# Patient Record
Sex: Male | Born: 1985 | Race: White | Hispanic: No | Marital: Married | Smoking: Light tobacco smoker
Health system: Southern US, Community
[De-identification: ages and names within clinical notes are randomized; demographics above are authoritative.]

## PROBLEM LIST (undated history)

## (undated) DIAGNOSIS — F419 Anxiety disorder, unspecified: Secondary | ICD-10-CM

## (undated) DIAGNOSIS — M109 Gout, unspecified: Secondary | ICD-10-CM

## (undated) DIAGNOSIS — R7989 Other specified abnormal findings of blood chemistry: Secondary | ICD-10-CM

## (undated) DIAGNOSIS — E785 Hyperlipidemia, unspecified: Secondary | ICD-10-CM

## (undated) DIAGNOSIS — R945 Abnormal results of liver function studies: Secondary | ICD-10-CM

## (undated) DIAGNOSIS — F17200 Nicotine dependence, unspecified, uncomplicated: Secondary | ICD-10-CM

## (undated) DIAGNOSIS — I10 Essential (primary) hypertension: Secondary | ICD-10-CM

## (undated) DIAGNOSIS — F1010 Alcohol abuse, uncomplicated: Secondary | ICD-10-CM

## (undated) HISTORY — DX: Nicotine dependence, unspecified, uncomplicated: F17.200

## (undated) HISTORY — DX: Gout, unspecified: M10.9

## (undated) HISTORY — DX: Anxiety disorder, unspecified: F41.9

## (undated) HISTORY — DX: Essential (primary) hypertension: I10

## (undated) HISTORY — DX: Other specified abnormal findings of blood chemistry: R79.89

## (undated) HISTORY — DX: Alcohol abuse, uncomplicated: F10.10

## (undated) HISTORY — DX: Hyperlipidemia, unspecified: E78.5

## (undated) HISTORY — DX: Abnormal results of liver function studies: R94.5

---

## 2018-05-01 ENCOUNTER — Other Ambulatory Visit: Payer: Self-pay

## 2018-05-01 ENCOUNTER — Inpatient Hospital Stay (HOSPITAL_COMMUNITY)
Admission: EM | Admit: 2018-05-01 | Discharge: 2018-05-10 | DRG: 897 | Disposition: A | Discharge status: Home / Self Care | Payer: Managed Care, Other (non HMO) | Attending: Internal Medicine | Admitting: Internal Medicine

## 2018-05-01 ENCOUNTER — Encounter (HOSPITAL_COMMUNITY): Payer: Self-pay

## 2018-05-01 DIAGNOSIS — M109 Gout, unspecified: Secondary | ICD-10-CM | POA: Diagnosis present

## 2018-05-01 DIAGNOSIS — Z716 Tobacco abuse counseling: Secondary | ICD-10-CM

## 2018-05-01 DIAGNOSIS — R000 Tachycardia, unspecified: Secondary | ICD-10-CM | POA: Diagnosis not present

## 2018-05-01 DIAGNOSIS — F10931 Alcohol use, unspecified with withdrawal delirium: Secondary | ICD-10-CM | POA: Diagnosis present

## 2018-05-01 DIAGNOSIS — Y901 Blood alcohol level of 20-39 mg/100 ml: Secondary | ICD-10-CM | POA: Diagnosis present

## 2018-05-01 DIAGNOSIS — F1010 Alcohol abuse, uncomplicated: Secondary | ICD-10-CM | POA: Diagnosis not present

## 2018-05-01 DIAGNOSIS — E876 Hypokalemia: Secondary | ICD-10-CM | POA: Diagnosis present

## 2018-05-01 DIAGNOSIS — E669 Obesity, unspecified: Secondary | ICD-10-CM | POA: Diagnosis present

## 2018-05-01 DIAGNOSIS — R440 Auditory hallucinations: Secondary | ICD-10-CM | POA: Diagnosis present

## 2018-05-01 DIAGNOSIS — M25529 Pain in unspecified elbow: Secondary | ICD-10-CM

## 2018-05-01 DIAGNOSIS — M7021 Olecranon bursitis, right elbow: Secondary | ICD-10-CM | POA: Diagnosis not present

## 2018-05-01 DIAGNOSIS — F17210 Nicotine dependence, cigarettes, uncomplicated: Secondary | ICD-10-CM | POA: Diagnosis present

## 2018-05-01 DIAGNOSIS — R441 Visual hallucinations: Secondary | ICD-10-CM | POA: Diagnosis present

## 2018-05-01 DIAGNOSIS — I10 Essential (primary) hypertension: Secondary | ICD-10-CM | POA: Diagnosis present

## 2018-05-01 DIAGNOSIS — F419 Anxiety disorder, unspecified: Secondary | ICD-10-CM | POA: Diagnosis present

## 2018-05-01 DIAGNOSIS — F17200 Nicotine dependence, unspecified, uncomplicated: Secondary | ICD-10-CM | POA: Diagnosis present

## 2018-05-01 DIAGNOSIS — Z7141 Alcohol abuse counseling and surveillance of alcoholic: Secondary | ICD-10-CM

## 2018-05-01 DIAGNOSIS — Z720 Tobacco use: Secondary | ICD-10-CM | POA: Diagnosis not present

## 2018-05-01 DIAGNOSIS — R945 Abnormal results of liver function studies: Secondary | ICD-10-CM | POA: Diagnosis present

## 2018-05-01 DIAGNOSIS — M10021 Idiopathic gout, right elbow: Secondary | ICD-10-CM | POA: Diagnosis not present

## 2018-05-01 DIAGNOSIS — R740 Nonspecific elevation of levels of transaminase and lactic acid dehydrogenase [LDH]: Secondary | ICD-10-CM | POA: Diagnosis not present

## 2018-05-01 DIAGNOSIS — Z6837 Body mass index (BMI) 37.0-37.9, adult: Secondary | ICD-10-CM | POA: Diagnosis not present

## 2018-05-01 DIAGNOSIS — M1990 Unspecified osteoarthritis, unspecified site: Secondary | ICD-10-CM | POA: Diagnosis not present

## 2018-05-01 DIAGNOSIS — F10231 Alcohol dependence with withdrawal delirium: Principal | ICD-10-CM | POA: Diagnosis present

## 2018-05-01 LAB — CBC
HCT: 44.9 % (ref 39.0–52.0)
Hemoglobin: 16.2 g/dL (ref 13.0–17.0)
MCH: 33.3 pg (ref 26.0–34.0)
MCHC: 36.1 g/dL — ABNORMAL HIGH (ref 30.0–36.0)
MCV: 92.4 fL (ref 80.0–100.0)
Platelets: 290 10*3/uL (ref 150–400)
RBC: 4.86 MIL/uL (ref 4.22–5.81)
RDW: 12.1 % (ref 11.5–15.5)
WBC: 6.3 10*3/uL (ref 4.0–10.5)
nRBC: 0 % (ref 0.0–0.2)

## 2018-05-01 LAB — COMPREHENSIVE METABOLIC PANEL
ALT: 189 U/L — ABNORMAL HIGH (ref 0–44)
AST: 165 U/L — ABNORMAL HIGH (ref 15–41)
Albumin: 3.7 g/dL (ref 3.5–5.0)
Alkaline Phosphatase: 44 U/L (ref 38–126)
Anion gap: 18 — ABNORMAL HIGH (ref 5–15)
CO2: 17 mmol/L — ABNORMAL LOW (ref 22–32)
Calcium: 9 mg/dL (ref 8.9–10.3)
Chloride: 100 mmol/L (ref 98–111)
Creatinine, Ser: 0.77 mg/dL (ref 0.61–1.24)
GFR calc Af Amer: >60 mL/min (ref >60)
Glucose, Bld: 141 mg/dL — ABNORMAL HIGH (ref 70–99)
Potassium: 3.5 mmol/L (ref 3.5–5.1)
Sodium: 135 mmol/L (ref 135–145)
Total Bilirubin: 0.8 mg/dL (ref 0.3–1.2)
Total Protein: 7.2 g/dL (ref 6.5–8.1)

## 2018-05-01 LAB — I-STAT TROPONIN, ED: Troponin i, poc: 0 ng/mL (ref 0.00–0.08)

## 2018-05-01 LAB — MAGNESIUM: MAGNESIUM: 1.5 mg/dL — AB (ref 1.7–2.4)

## 2018-05-01 LAB — ETHANOL: Alcohol, Ethyl (B): 32 mg/dL — ABNORMAL HIGH (ref <10)

## 2018-05-01 LAB — PHOSPHORUS: Phosphorus: 3.4 mg/dL (ref 2.5–4.6)

## 2018-05-01 MED ORDER — LORAZEPAM 2 MG/ML IJ SOLN
0.0000 mg | Freq: Four times a day (QID) | INTRAMUSCULAR | Status: AC
  Administered 2018-05-01: 2 mg via INTRAVENOUS
  Administered 2018-05-02: 4 mg via INTRAVENOUS
  Administered 2018-05-02: 1 mg via INTRAVENOUS
  Administered 2018-05-02: 3 mg via INTRAVENOUS
  Administered 2018-05-03: 1 mg via INTRAVENOUS
  Administered 2018-05-03: 2 mg via INTRAVENOUS
  Administered 2018-05-03 (×2): 1 mg via INTRAVENOUS
  Filled 2018-05-01: qty 1
  Filled 2018-05-01: qty 2
  Filled 2018-05-01 (×5): qty 1
  Filled 2018-05-01: qty 2

## 2018-05-01 MED ORDER — ONDANSETRON HCL 4 MG PO TABS: 4.0000 mg | ORAL_TABLET | Freq: Four times a day (QID) | ORAL | Status: DC | PRN

## 2018-05-01 MED ORDER — LORAZEPAM 1 MG PO TABS: 1.0000 mg | ORAL_TABLET | Freq: Four times a day (QID) | ORAL | Status: AC | PRN

## 2018-05-01 MED ORDER — NICOTINE 21 MG/24HR TD PT24
21.0000 mg | MEDICATED_PATCH | Freq: Every day | TRANSDERMAL | Status: DC
  Administered 2018-05-01 – 2018-05-10 (×10): 21 mg via TRANSDERMAL
  Filled 2018-05-01 (×10): qty 1

## 2018-05-01 MED ORDER — LORAZEPAM 2 MG/ML IJ SOLN
0.0000 mg | Freq: Four times a day (QID) | INTRAMUSCULAR | Status: DC
  Administered 2018-05-01 (×2): 2 mg via INTRAVENOUS
  Filled 2018-05-01 (×2): qty 1

## 2018-05-01 MED ORDER — ONDANSETRON HCL 4 MG/2ML IJ SOLN: 4.0000 mg | Freq: Four times a day (QID) | INTRAMUSCULAR | Status: DC | PRN

## 2018-05-01 MED ORDER — HEPARIN SODIUM (PORCINE) 5000 UNIT/ML IJ SOLN
5000.0000 [IU] | Freq: Three times a day (TID) | INTRAMUSCULAR | Status: DC
  Filled 2018-05-01 (×2): qty 1

## 2018-05-01 MED ORDER — SODIUM CHLORIDE 0.9 % IV SOLN
INTRAVENOUS | Status: DC
  Administered 2018-05-01 (×2): via INTRAVENOUS

## 2018-05-01 MED ORDER — LORAZEPAM 1 MG PO TABS: 0.0000 mg | ORAL_TABLET | Freq: Four times a day (QID) | ORAL | Status: DC

## 2018-05-01 MED ORDER — DEXTROSE-NACL 5-0.45 % IV SOLN
INTRAVENOUS | Status: DC
  Administered 2018-05-01 – 2018-05-03 (×3): via INTRAVENOUS

## 2018-05-01 MED ORDER — LORAZEPAM 1 MG PO TABS: 0.0000 mg | ORAL_TABLET | Freq: Two times a day (BID) | ORAL | Status: DC

## 2018-05-01 MED ORDER — LORAZEPAM 2 MG/ML IJ SOLN: 0.0000 mg | INTRAMUSCULAR | Status: DC

## 2018-05-01 MED ORDER — LORAZEPAM 2 MG/ML IJ SOLN
0.0000 mg | Freq: Two times a day (BID) | INTRAMUSCULAR | Status: AC
  Administered 2018-05-04 – 2018-05-05 (×4): 1 mg via INTRAVENOUS
  Filled 2018-05-01 (×4): qty 1

## 2018-05-01 MED ORDER — VITAMIN B-1 100 MG PO TABS
100.0000 mg | ORAL_TABLET | Freq: Every day | ORAL | Status: DC
  Administered 2018-05-01 – 2018-05-10 (×10): 100 mg via ORAL
  Filled 2018-05-01 (×10): qty 1

## 2018-05-01 MED ORDER — LORAZEPAM 2 MG/ML IJ SOLN: 1.0000 mg | INTRAMUSCULAR | Status: DC | PRN

## 2018-05-01 MED ORDER — LORAZEPAM 2 MG/ML IJ SOLN
1.0000 mg | Freq: Four times a day (QID) | INTRAMUSCULAR | Status: AC | PRN
  Administered 2018-05-02 – 2018-05-04 (×2): 1 mg via INTRAVENOUS
  Filled 2018-05-01 (×2): qty 1

## 2018-05-01 MED ORDER — LORAZEPAM 1 MG PO TABS: 0.0000 mg | ORAL_TABLET | ORAL | Status: DC

## 2018-05-01 MED ORDER — MAGNESIUM SULFATE 2 GM/50ML IV SOLN
2.0000 g | Freq: Once | INTRAVENOUS | Status: AC
  Administered 2018-05-01: 2 g via INTRAVENOUS
  Filled 2018-05-01: qty 50

## 2018-05-01 MED ORDER — ADULT MULTIVITAMIN W/MINERALS CH
1.0000 | ORAL_TABLET | Freq: Every day | ORAL | Status: DC
  Administered 2018-05-02 – 2018-05-10 (×9): 1 via ORAL
  Filled 2018-05-01 (×9): qty 1

## 2018-05-01 MED ORDER — THIAMINE HCL 100 MG/ML IJ SOLN
Freq: Once | INTRAVENOUS | Status: AC
  Administered 2018-05-02: 01:00:00 via INTRAVENOUS
  Filled 2018-05-01: qty 1000

## 2018-05-01 MED ORDER — LORAZEPAM 2 MG/ML IJ SOLN: 0.0000 mg | Freq: Two times a day (BID) | INTRAMUSCULAR | Status: DC

## 2018-05-01 MED ORDER — FOLIC ACID 5 MG/ML IJ SOLN
1.0000 mg | Freq: Every day | INTRAMUSCULAR | Status: DC
  Administered 2018-05-01 – 2018-05-02 (×2): 1 mg via INTRAVENOUS
  Filled 2018-05-01 (×2): qty 0.2

## 2018-05-01 NOTE — H&P (Signed)
History and Physical   Sean Ibarra WUJ:[id number] DOB: [birth year] DOA: 05/01/2018  Referring MD/NP/PA: Dr Clarene DukeLittle  PCP: Patient, No Pcp Per   Outpatient Specialists: None  Patient coming from: Home  Chief Complaint: Alcohol withdrawal  HPI: Sean Ibarra is a 33 y.o. male with medical history significant of Alcohol abuse, who drinks excessively and went on a 3-week binge of alcohol drinking about half a gallon of liquor every day.  Patient has been taking whiskey most of the time.  His last drink was about 2 hours prior to arrival in the ER.  He has been trying to decrease his alcohol intake and then started feeling generalized body aches chest pain nausea chills.  Patient came in to the ER for treatment.  He is hoping to quit drinking.  No fever no chills at the moment.  No diarrhea.  Patient however is having significant withdrawal symptoms and not safe to go home so he is being admitted to the hospital for treatment..  ED Course: Temperature is 98.7 blood pressure 179/125 pulse 117 respiratory of 25 oxygen sat 97% on room air.  CBC within normal chemistry showed a CO2 of 17 otherwise all within normal.  Glucose 141.  Patient received IV fluids and is being admitted to the hospital.  Review of Systems: As per HPI otherwise 10 point review of systems negative.    History reviewed. No pertinent past medical history.  History reviewed. No pertinent surgical history.   reports that he has quit smoking. His smoking use included cigarettes. He smoked 0.50 packs per day. He has never used smokeless tobacco. He reports that he does not use drugs. No history on file for alcohol.  No Known Allergies  No family history on file.   Prior to Admission medications   Not on File    Physical Exam: Vitals:   05/01/18 2015 05/01/18 2030 05/01/18 2045 05/01/18 2100  BP: (!) 143/86 (!) 150/98 (!) 149/105 (!) 150/98  Pulse: (!) 104 (!) 110 (!) 110 (!) 103  Resp: (!) 25 20 (!) 21   Temp:        TempSrc:      SpO2: 96% 95% 96%       Constitutional: Tremulous, irritable, no acute distress Vitals:   05/01/18 2015 05/01/18 2030 05/01/18 2045 05/01/18 2100  BP: (!) 143/86 (!) 150/98 (!) 149/105 (!) 150/98  Pulse: (!) 104 (!) 110 (!) 110 (!) 103  Resp: (!) 25 20 (!) 21   Temp:      TempSrc:      SpO2: 96% 95% 96%    Eyes: PERRL, lids and conjunctivae normal ENMT: Mucous membranes are moist. Posterior pharynx clear of any exudate or lesions.Normal dentition.  Neck: normal, supple, no masses, no thyromegaly Respiratory: clear to auscultation bilaterally, no wheezing, no crackles. Normal respiratory effort. No accessory muscle use.  Cardiovascular: Regular rate and rhythm, no murmurs / rubs / gallops. No extremity edema. 2+ pedal pulses. No carotid bruits.  Abdomen: no tenderness, no masses palpated. No hepatosplenomegaly. Bowel sounds positive.  Musculoskeletal: no clubbing / cyanosis. No joint deformity upper and lower extremities. Good ROM, no contractures. Normal muscle tone.  Skin: no rashes, lesions, ulcers. No induration Neurologic: CN 2-12 grossly intact. Sensation intact, DTR normal. Strength 5/5 in all 4.  Psychiatric: Normal judgment and insight. Alert and oriented x 3. Normal mood.     Labs on Admission: I have personally reviewed following labs and imaging studies  CBC: Recent Labs  Lab 05/01/18  1919  WBC 6.3  HGB 16.2  HCT 44.9  MCV 92.4  PLT 290   Basic Metabolic Panel: Recent Labs  Lab 05/01/18 1919  NA 135  K 3.5  CL 100  CO2 17*  GLUCOSE 141*  BUN <5*  CREATININE 0.77  CALCIUM 9.0  MG 1.5*  PHOS 3.4   GFR: CrCl cannot be calculated (Unknown ideal weight.). Liver Function Tests: Recent Labs  Lab 05/01/18 1919  AST 165*  ALT 189*  ALKPHOS 44  BILITOT 0.8  PROT 7.2  ALBUMIN 3.7   No results for input(s): LIPASE, AMYLASE in the last 168 hours. No results for input(s): AMMONIA in the last 168 hours. Coagulation Profile: No  results for input(s): INR, PROTIME in the last 168 hours. Cardiac Enzymes: No results for input(s): CKTOTAL, CKMB, CKMBINDEX, TROPONINI in the last 168 hours. BNP (last 3 results) No results for input(s): PROBNP in the last 8760 hours. HbA1C: No results for input(s): HGBA1C in the last 72 hours. CBG: No results for input(s): GLUCAP in the last 168 hours. Lipid Profile: No results for input(s): CHOL, HDL, LDLCALC, TRIG, CHOLHDL, LDLDIRECT in the last 72 hours. Thyroid Function Tests: No results for input(s): TSH, T4TOTAL, FREET4, T3FREE, THYROIDAB in the last 72 hours. Anemia Panel: No results for input(s): VITAMINB12, FOLATE, FERRITIN, TIBC, IRON, RETICCTPCT in the last 72 hours. Urine analysis: No results found for: COLORURINE, APPEARANCEUR, LABSPEC, PHURINE, GLUCOSEU, HGBUR, BILIRUBINUR, KETONESUR, PROTEINUR, UROBILINOGEN, NITRITE, LEUKOCYTESUR Sepsis Labs: @LABRCNTIP (procalcitonin:4,lacticidven:4) )No results found for this or any previous visit (from the past 240 hour(s)).   Radiological Exams on Admission: No results found.  Assessment/Plan Principal Problem:   Delirium tremens (HCC) Active Problems:   Alcohol abuse   Tobacco abuse   Benign essential HTN   Sinus tachycardia     #1 alcohol withdrawal syndrome: Patient will be admitted and CIWA protocol will be initiated.  Counseling and supportive care will continue.  #2 hypertension: Not previously diagnosed.  Elevated blood pressure may be related to delirium tremens that is setting in.  We will watch and treat with as needed beta-blockers  #3 tobacco abuse: Nicotine patch will be added with counseling provided  #4 history of alcohol abuse: Patient will need long-term referral for alcohol cessation counseling.   DVT prophylaxis: Heparin Code Status: Full code Family Communication: No family at bedside Disposition Plan: Home Consults called: None Admission status: Inpatient  Severity of Illness: The  appropriate patient status for this patient is INPATIENT. Inpatient status is judged to be reasonable and necessary in order to provide the required intensity of service to ensure the patient's safety. The patient's presenting symptoms, physical exam findings, and initial radiographic and laboratory data in the context of their chronic comorbidities is felt to place them at high risk for further clinical deterioration. Furthermore, it is not anticipated that the patient will be medically stable for discharge from the hospital within 2 midnights of admission. The following factors support the patient status of inpatient.   " The patient's presenting symptoms include tremors and symptoms of alcohol withdrawals. " The worrisome physical exam findings include tremors irritability and anxiety. " The initial radiographic and laboratory data are worrisome because of alcohol level was elevated. " The chronic co-morbidities include history of alcoholism.   * I certify that at the point of admission it is my clinical judgment that the patient will require inpatient hospital care spanning beyond 2 midnights from the point of admission due to high intensity of service, high risk  for further deterioration and high frequency of surveillance required.Lonia Blood*    Cortlin Marano,LAWAL MD [facility] Pager [phone]  If 7PM-7AM, please contact night-coverage [email or web address] Password [email or web address]  05/01/2018, 9:20 PM

## 2018-05-01 NOTE — ED Provider Notes (Signed)
[facility] EMERGENCY DEPARTMENT Provider Note   CSN: [id number] Arrival date & time: 05/01/18  1752     History   Chief Complaint Chief Complaint  Patient presents with  . Alcohol Intoxication    HPI Sean Ibarra is a 33 y.o. male.  33 y.o male with no PMH presents to the ED with a chief complaint of a Hall intoxication.  Patient reports he has gone on a 3-week binge of alcohol, stating he drinks about a half a gallon of liquor every day, preferably [employer]  Reports trying to stop drinking this morning reports his last drink was 2 hours prior to arrival, states he has been sipping on liquor the whole day and has never had any previous episodes of withdrawal.  Dates feeling nauseous, body aches along with chest pain as he begins to sober up.  So endorses some tremors, leg irritations along with chills.  He reports he is never been previously hospitalized for alcohol withdrawals has had alcohol problems in the past.  Denies any shortness of breath, abdominal pain, other complaints at this time.         Home Medications    Prior to Admission medications   Not on File    Family History No family history on file.  Social History Social History   Tobacco Use  . Smoking status: Former Smoker    Packs/day: 0.50    Types: Cigarettes  . Smokeless tobacco: Never Used  Substance Use Topics  . Alcohol use: Not on file  . Drug use: Never     Allergies   Patient has no known allergies.   Review of Systems Review of Systems  Constitutional: Negative for chills and fever.  HENT: Negative for ear pain and sore throat.   Eyes: Negative for pain and visual disturbance.  Respiratory: Positive for shortness of breath. Negative for cough.   Cardiovascular: Positive for chest pain. Negative for palpitations.  Gastrointestinal: Negative for abdominal pain and vomiting.  Genitourinary: Negative for dysuria and hematuria.  Musculoskeletal:  Positive for myalgias. Negative for arthralgias and back pain.  Skin: Negative for color change and rash.  Neurological: Positive for tremors and weakness. Negative for seizures and syncope.  Psychiatric/Behavioral: Positive for hallucinations. The patient is nervous/anxious.   All other systems reviewed and are negative.    Physical Exam Updated Vital Signs BP (!) 150/98   Pulse (!) 103   Temp 98.7 F (37.1 C) (Oral)   Resp (!) 21   SpO2 96%   Physical Exam Vitals signs and nursing note reviewed.  Constitutional:      Appearance: He is toxic-appearing.  HENT:     Head: Normocephalic and atraumatic.     Nose: Nose normal.     Mouth/Throat:     Pharynx: Oropharynx is clear.  Eyes:     General: No scleral icterus.       Right eye: No discharge.        Left eye: No discharge.     Pupils: Pupils are equal, round, and reactive to light.  Neck:     Musculoskeletal: Normal range of motion and neck supple.  Cardiovascular:     Rate and Rhythm: Regular rhythm. Tachycardia present.  Pulmonary:     Effort: Pulmonary effort is normal.     Breath sounds: Normal breath sounds. No wheezing or rhonchi.  Abdominal:     General: Abdomen is flat. Bowel sounds are normal. There is no distension.  Tenderness: There is no abdominal tenderness. There is no right CVA tenderness or left CVA tenderness.  Musculoskeletal: Normal range of motion.  Skin:    General: Skin is warm and dry.  Neurological:     General: No focal deficit present.     Mental Status: He is alert and oriented to person, place, and time.     Comments: Alert, oriented, thought content appropriate. Speech fluent without evidence of aphasia. Able to follow 2 step commands without difficulty.  Cranial Nerves:  II:  Peripheral visual fields grossly normal, pupils, round, reactive to light III,IV, VI: ptosis not present, extra-ocular motions intact bilaterally  V,VII: smile symmetric, facial light touch sensation  equal VIII: hearing grossly normal bilaterally  IX,X: midline uvula rise  XI: bilateral shoulder shrug equal and strong XII: midline tongue extension  Motor:  5/5 in upper and lower extremities bilaterally including strong and equal grip strength and dorsiflexion/plantar flexion Sensory: light touch normal in all extremities.  Cerebellar: normal finger-to-nose with bilateral upper extremities, pronator drift negative        ED Treatments / Results  Labs (all labs ordered are listed, but only abnormal results are displayed) Labs Reviewed  COMPREHENSIVE METABOLIC PANEL - Abnormal; Notable for the following components:      Result Value   CO2 17 (*)    Glucose, Bld 141 (*)    BUN <5 (*)    AST 165 (*)    ALT 189 (*)    Anion gap 18 (*)    All other components within normal limits  MAGNESIUM - Abnormal; Notable for the following components:   Magnesium 1.5 (*)    All other components within normal limits  CBC - Abnormal; Notable for the following components:   MCHC 36.1 (*)    All other components within normal limits  ETHANOL - Abnormal; Notable for the following components:   Alcohol, Ethyl (B) 32 (*)    All other components within normal limits  PHOSPHORUS  I-STAT TROPONIN, ED    EKG EKG Interpretation  Date/Time:  Sunday May 01 2018 19:48:42 EST Ventricular Rate:  103 PR Interval:    QRS Duration: 93 QT Interval:  348 QTC Calculation: 456 R Axis:   45 Text Interpretation:  Sinus tachycardia Low voltage, precordial leads No previous ECGs available Confirmed by Frederick Peers [phone]) on 05/01/2018 8:56:46 PM   Radiology No results found.  Procedures Procedures (including critical care time)  Medications Ordered in ED Medications  0.9 %  sodium chloride infusion ( Intravenous New Bag/Given 05/01/18 1940)  thiamine (VITAMIN B-1) tablet 100 mg (100 mg Oral Given 05/01/18 1941)  folic acid injection 1 mg (1 mg Intravenous Given 05/01/18 1942)  LORazepam  (ATIVAN) injection 0-4 mg (2 mg Intravenous Given 05/01/18 1941)    Or  LORazepam (ATIVAN) tablet 0-4 mg ( Oral See Alternative 05/01/18 1941)  LORazepam (ATIVAN) injection 0-4 mg (has no administration in time range)    Or  LORazepam (ATIVAN) tablet 0-4 mg (has no administration in time range)  magnesium sulfate IVPB 2 g 50 mL (2 g Intravenous New Bag/Given 05/01/18 2108)     Initial Impression / Assessment and Plan / ED Course  I have reviewed the triage vital signs and the nursing notes.  Pertinent labs & imaging results that were available during my care of the patient were reviewed by me and considered in my medical decision making (see chart for details).   Patient presents to the ED after going on  an alcohol binge x 3 weeks over the holidays. Reports attempting to decrease his alcohol intake this evening with his last drink being 2 hours prior to arrival to the ED. Reports feeling overall malaise along with weakness and tremors.   CMP showed significant elevation of AST & ALT, patient reports alcohol abuse for a couple of years. Magnesium is 1.5, will replace with 2g. CBC showed no leukocytosis, hemoglobin is stable. First troponin is negative. Ethanol level is 32. Patient has received folate, thiamine, bolus but is persistently tachycardic and hypertensive, has received ativan x 3 (2mg  each time) will place call for admission for alcohol withdrawal.   Final Clinical Impressions(s) / ED Diagnoses   Final diagnoses:  Alcohol withdrawal delirium [facility](HCC)  Hypomagnesemia    ED Discharge Orders    None       Claude MangesSoto, Chucky Homes, Cordelia Poche-C 05/01/18 2120    Little, Ambrose Finlandachel Morgan, MD 05/01/18 2324

## 2018-05-01 NOTE — ED Triage Notes (Signed)
Pt arrives via POV with reports of drinking 1/2 gallon of liquor daily for approx 3 weeks. Pt states last drink 2 hours ago.

## 2018-05-01 NOTE — ED Notes (Signed)
Nurse currently starting IV and will draw labs. 

## 2018-05-01 NOTE — BHH Counselor (Signed)
Clinician spoke to [employer], RN and noted the TTS consult was put in my mistake. TTS to assess pt once medically cleared, labs in and pt is assessed by EDP/PA. Crystal, RN another consult will be in when pt is medically cleared.   Redmond Pulling, MS, [facility], [facility] Triage Specialist [phone]

## 2018-05-02 ENCOUNTER — Other Ambulatory Visit: Payer: Self-pay

## 2018-05-02 DIAGNOSIS — Z720 Tobacco use: Secondary | ICD-10-CM

## 2018-05-02 LAB — COMPREHENSIVE METABOLIC PANEL
ALT: 159 U/L — ABNORMAL HIGH (ref 0–44)
AST: 157 U/L — ABNORMAL HIGH (ref 15–41)
Albumin: 3.1 g/dL — ABNORMAL LOW (ref 3.5–5.0)
Alkaline Phosphatase: 38 U/L (ref 38–126)
Anion gap: 12 (ref 5–15)
BUN: 5 mg/dL — ABNORMAL LOW (ref 6–20)
CHLORIDE: 100 mmol/L (ref 98–111)
CO2: 24 mmol/L (ref 22–32)
Calcium: 8.4 mg/dL — ABNORMAL LOW (ref 8.9–10.3)
Creatinine, Ser: 0.81 mg/dL (ref 0.61–1.24)
GFR calc Af Amer: >60 mL/min (ref >60)
GFR calc non Af Amer: >60 mL/min (ref >60)
Glucose, Bld: 120 mg/dL — ABNORMAL HIGH (ref 70–99)
Potassium: 3.5 mmol/L (ref 3.5–5.1)
Sodium: 136 mmol/L (ref 135–145)
Total Bilirubin: 1.2 mg/dL (ref 0.3–1.2)
Total Protein: 6.1 g/dL — ABNORMAL LOW (ref 6.5–8.1)

## 2018-05-02 LAB — CBC
HCT: 39.2 % (ref 39.0–52.0)
Hemoglobin: 14.4 g/dL (ref 13.0–17.0)
MCH: 34.2 pg — ABNORMAL HIGH (ref 26.0–34.0)
MCHC: 36.7 g/dL — ABNORMAL HIGH (ref 30.0–36.0)
MCV: 93.1 fL (ref 80.0–100.0)
NRBC: 0 % (ref 0.0–0.2)
Platelets: 267 10*3/uL (ref 150–400)
RBC: 4.21 MIL/uL — AB (ref 4.22–5.81)
RDW: 12.5 % (ref 11.5–15.5)
WBC: 7.3 10*3/uL (ref 4.0–10.5)

## 2018-05-02 LAB — MAGNESIUM: Magnesium: 1.9 mg/dL (ref 1.7–2.4)

## 2018-05-02 LAB — HIV ANTIBODY (ROUTINE TESTING W REFLEX): HIV SCREEN 4TH GENERATION: NONREACTIVE

## 2018-05-02 MED ORDER — HYDRALAZINE HCL 20 MG/ML IJ SOLN: 5.0000 mg | Freq: Three times a day (TID) | INTRAMUSCULAR | Status: DC | PRN

## 2018-05-02 MED ORDER — FOLIC ACID 1 MG PO TABS
1.0000 mg | ORAL_TABLET | Freq: Every day | ORAL | Status: DC
  Administered 2018-05-03 – 2018-05-10 (×8): 1 mg via ORAL
  Filled 2018-05-02 (×8): qty 1

## 2018-05-02 NOTE — Progress Notes (Signed)
Patient refusing bed alarm. Patient education provided. Will continue to educate patient. Patient informed to call us using the call bell if he needs to get out of bed. Will continue to monitor.

## 2018-05-02 NOTE — Progress Notes (Signed)
New Admission Note:  Arrival Method: By bed from ED around 2300 Mental Orientation: Alert and oriented  Telemetry: [address], CCMD notified Assessment: Completed Skin: Completed, refer to flowsheets IV: L forearm  Pain: Denies Tubes: None Safety Measures: Safety Fall Prevention Plan was given, discussed and signed. Admission: Completed [facility] Orientation: Patient has been orientated to the room, unit and the staff. Family: None  Orders have been reviewed and implemented. Will continue to monitor the patient. Call light has been placed within reach and bed alarm has been activated.   Alfonse Ras, RN  Phone Number: [phone]

## 2018-05-02 NOTE — Progress Notes (Signed)
PROGRESS NOTE   Sean Ibarra  BOF:[id number]    DOB: 01-13-1986    DOA: 05/01/2018  PCP: Patient, No Pcp Per   I have briefly reviewed patients previous medical records in [facility].  Brief Narrative:  33 year old male, no significant PMH, history of alcohol and tobacco abuse, reportedly has been on a 3-week binge of heavy liquor drinking, tried to stop on day of admission but started developing shakiness, tremulousness, anxiety, visual hallucinations and admitted for evaluation and management of alcohol withdrawal.   Assessment & Plan:   Principal Problem:   Delirium tremens (HCC) Active Problems:   Alcohol abuse   Tobacco abuse   Benign essential HTN   Sinus tachycardia   1. Alcohol abuse with withdrawal: BAL on admission 32.  Continue scheduled and PRN Ativan, thiamine, folate and multivitamins per CIWA protocol. CIWA scores high: 14-17.  At risk for florid DTs.  Monitor closely and if declines, transferred to stepdown or ICU. 2. Alcoholic hepatitis: AST and ALT in the 100s.  Follow LFTs.  Check INR in a.m. 3. Hypomagnesemia: Replaced. 4. Tobacco abuse: Cessation counseled.  Continue nicotine patch. 5. Elevated blood pressure: Likely part of alcohol withdrawal syndrome.  Treat as above.  PRN IV hydralazine.   DVT prophylaxis: Subcutaneous heparin. Code Status: Full Family Communication: None at bedside Disposition: DC home pending clinical improvement.   Consultants:  None  Procedures:  None  Antimicrobials:  None   Subjective: Interviewed and examined with RN in room.  Feels anxious and tremulous.  As per RN, reports visual hallucinations "walls closing in on him".  Denies any other complaints.  States that he has been drinking a quarter to half gallon of whiskey daily for the last 3 weeks.  Last drink was 2 hours prior to hospital arrival.  Smokes a pack of cigarettes per day.  Denies drug abuse.  Indicates that he is a [employer].  ROS: As  above.  Objective:  Vitals:   05/01/18 2115 05/01/18 2244 05/02/18 0844 05/02/18 1148  BP: (!) 143/95 (!) 154/119 (!) 143/98 (!) 143/98  Pulse: (!) 107 (!) 123 (!) 114 (!) 114  Resp: (!) 24 20 20 20   Temp:  98.9 F (37.2 C) 98.8 F (37.1 C) 98.8 F (37.1 C)  TempSrc:  Oral Oral Oral  SpO2: 96% 95% 97%   Weight:    127.4 kg  Height:    6' (1.829 m)    Examination:  General exam: Pleasant young male, moderately built and obese, seen ambulating comfortably in the room. Respiratory system: Clear to auscultation. Respiratory effort normal. Cardiovascular system: S1 & S2 heard, RRR. No JVD, murmurs, rubs, gallops or clicks. No pedal edema.  Telemetry personally reviewed: Mild sinus tachycardia in the 110s. Gastrointestinal system: Abdomen is nondistended, soft and nontender. No organomegaly or masses felt. Normal bowel sounds heard. Central nervous system: Alert and oriented. No focal neurological deficits. Extremities: Symmetric 5 x 5 power.  Mildly tremulous. Skin: No rashes, lesions or ulcers.  Multiple generalized tattoos. Psychiatry: Judgement and insight appear normal. Mood & affect appears slightly anxious.     Data Reviewed: I have personally reviewed following labs and imaging studies  CBC: Recent Labs  Lab 05/01/18 1919 05/02/18 0459  WBC 6.3 7.3  HGB 16.2 14.4  HCT 44.9 39.2  MCV 92.4 93.1  PLT 290 267   Basic Metabolic Panel: Recent Labs  Lab 05/01/18 1919 05/02/18 0459  NA 135 136  K 3.5 3.5  CL 100 100  CO2 17* 24  GLUCOSE 141* 120*  BUN <5* 5*  CREATININE 0.77 0.81  CALCIUM 9.0 8.4*  MG 1.5* 1.9  PHOS 3.4  --    Liver Function Tests: Recent Labs  Lab 05/01/18 1919 05/02/18 0459  AST 165* 157*  ALT 189* 159*  ALKPHOS 44 38  BILITOT 0.8 1.2  PROT 7.2 6.1*  ALBUMIN 3.7 3.1*   Coagulation Profile: No results for input(s): INR, PROTIME in the last 168 hours.        Radiology Studies: No results found.      Scheduled Meds: .  [START ON 05/03/2018] folic acid  1 mg Oral Daily  . heparin  5,000 Units Subcutaneous Q8H  . LORazepam  0-4 mg Intravenous Q6H   Followed by  . [START ON 05/04/2018] LORazepam  0-4 mg Intravenous Q12H  . multivitamin with minerals  1 tablet Oral Daily  . nicotine  21 mg Transdermal Daily  . thiamine  100 mg Oral Daily   Continuous Infusions: . dextrose 5 % and 0.45% NaCl 75 mL/hr at 05/01/18 2328     LOS: 1 day     Marcellus ScottAnand Shantrice Rodenberg, MD, FACP, [facility]. [facility] Pager [phone]  If 7PM-7AM, please contact night-coverage [email or web address] Password [email or web address] 05/02/2018, 12:18 PM

## 2018-05-02 NOTE — Progress Notes (Signed)
Patient continues to pull telemetry off. Patient educated on importance of keeping his heart monitor on. Will continue to monitor and re-educate.

## 2018-05-03 LAB — COMPREHENSIVE METABOLIC PANEL
ALBUMIN: 3 g/dL — AB (ref 3.5–5.0)
ALT: 175 U/L — ABNORMAL HIGH (ref 0–44)
ANION GAP: 11 (ref 5–15)
AST: 171 U/L — AB (ref 15–41)
Alkaline Phosphatase: 43 U/L (ref 38–126)
CO2: 24 mmol/L (ref 22–32)
Calcium: 8.7 mg/dL — ABNORMAL LOW (ref 8.9–10.3)
Chloride: 102 mmol/L (ref 98–111)
Creatinine, Ser: 0.88 mg/dL (ref 0.61–1.24)
GFR calc Af Amer: >60 mL/min (ref >60)
GFR calc non Af Amer: >60 mL/min (ref >60)
Glucose, Bld: 108 mg/dL — ABNORMAL HIGH (ref 70–99)
POTASSIUM: 3.2 mmol/L — AB (ref 3.5–5.1)
Sodium: 137 mmol/L (ref 135–145)
Total Bilirubin: 1.3 mg/dL — ABNORMAL HIGH (ref 0.3–1.2)
Total Protein: 6.2 g/dL — ABNORMAL LOW (ref 6.5–8.1)

## 2018-05-03 LAB — CBC
HCT: 37 % — ABNORMAL LOW (ref 39.0–52.0)
Hemoglobin: 13.7 g/dL (ref 13.0–17.0)
MCH: 34.4 pg — ABNORMAL HIGH (ref 26.0–34.0)
MCHC: 37 g/dL — ABNORMAL HIGH (ref 30.0–36.0)
MCV: 93 fL (ref 80.0–100.0)
Platelets: 230 10*3/uL (ref 150–400)
RBC: 3.98 MIL/uL — ABNORMAL LOW (ref 4.22–5.81)
RDW: 12.2 % (ref 11.5–15.5)
WBC: 6.4 10*3/uL (ref 4.0–10.5)
nRBC: 0 % (ref 0.0–0.2)

## 2018-05-03 LAB — PROTIME-INR
INR: 1.1
PROTHROMBIN TIME: 14.1 s (ref 11.4–15.2)

## 2018-05-03 MED ORDER — POTASSIUM CHLORIDE CRYS ER 20 MEQ PO TBCR
40.0000 meq | EXTENDED_RELEASE_TABLET | Freq: Once | ORAL | Status: AC
  Administered 2018-05-03: 40 meq via ORAL
  Filled 2018-05-03: qty 2

## 2018-05-03 MED ORDER — TRAMADOL HCL 50 MG PO TABS
50.0000 mg | ORAL_TABLET | Freq: Once | ORAL | Status: AC
  Administered 2018-05-03: 50 mg via ORAL
  Filled 2018-05-03: qty 1

## 2018-05-03 MED ORDER — HYDRALAZINE HCL 20 MG/ML IJ SOLN: 10.0000 mg | Freq: Three times a day (TID) | INTRAMUSCULAR | Status: DC | PRN

## 2018-05-03 MED ORDER — ACETAMINOPHEN 325 MG PO TABS
650.0000 mg | ORAL_TABLET | Freq: Four times a day (QID) | ORAL | Status: DC | PRN
  Administered 2018-05-03 – 2018-05-06 (×9): 650 mg via ORAL
  Filled 2018-05-03 (×8): qty 2

## 2018-05-03 NOTE — Progress Notes (Signed)
PROGRESS NOTE   Sean Ibarra  JKD:[id number]    DOB: 31-Aug-1985    DOA: 05/01/2018  PCP: Patient, No Pcp Per   I have briefly reviewed patients previous medical records in [facility].  Brief Narrative:  33 year old male, no significant PMH, history of alcohol and tobacco abuse, reportedly has been on a 3-week binge of heavy liquor drinking, tried to stop on day of admission but started developing shakiness, tremulousness, anxiety, visual hallucinations and admitted for evaluation and management of alcohol withdrawal.  Slowly improving.   Assessment & Plan:   Principal Problem:   Delirium tremens (HCC) Active Problems:   Alcohol abuse   Tobacco abuse   Benign essential HTN   Sinus tachycardia   1. Alcohol abuse with withdrawal: BAL on admission 32.  Continue scheduled and PRN Ativan, thiamine, folate and multivitamins per CIWA protocol. CIWA scores improving today.  At risk for florid DTs.  Monitor closely and if declines, transferred to stepdown or ICU.  Continue current management. 2. Alcoholic hepatitis: AST and ALT in the 100s.  Periodically follow LFTs.  INR normal.  Alcohol abstinence counseled. 3. Hypomagnesemia: Replaced. 4. Tobacco abuse: Cessation counseled.  Continue nicotine patch. 5. Elevated blood pressure: Likely part of alcohol withdrawal syndrome.  Treat as above.  PRN IV hydralazine. 6. Hypokalemia: Replace and follow.   DVT prophylaxis: Subcutaneous heparin. Code Status: Full Family Communication: None at bedside Disposition: DC home pending clinical improvement, may take another 48 hours.   Consultants:  None  Procedures:  None  Antimicrobials:  None   Subjective: Feels somewhat better.  Still having some visual hallucinations- "balls flying around" and auditory hallucinations where he hears people that are not there speaking nonspecifically.  Feels less shaky and anxious.  ROS: As above.  Objective:  Vitals:   05/02/18 1739 05/02/18  2003 05/03/18 0437 05/03/18 0935  BP: 129/90 (!) 148/100 (!) 137/102 (!) 142/100  Pulse: 95 (!) 107 (!) 102 88  Resp: 18 20 18 20   Temp: 98.9 F (37.2 C) 98.4 F (36.9 C) 98.8 F (37.1 C) 99.1 F (37.3 C)  TempSrc: Oral Oral Oral Oral  SpO2: 97% 99% 98% 98%  Weight:      Height:        Examination:  General exam: Pleasant young male, moderately built and obese, lying comfortably supine in bed.  Looks better than he did yesterday. Respiratory system: Clear to auscultation. Respiratory effort normal.  Stable. Cardiovascular system: S1 & S2 heard, RRR. No JVD, murmurs, rubs, gallops or clicks. No pedal edema.  Patient refusing to keep telemetry on, not on telemetry at this time. Gastrointestinal system: Abdomen is nondistended, soft and nontender. No organomegaly or masses felt. Normal bowel sounds heard.  Stable Central nervous system: Alert and oriented. No focal neurological deficits.  Stable Extremities: Symmetric 5 x 5 power.  Mildly tremulous. Skin: No rashes, lesions or ulcers.  Multiple generalized tattoos. Psychiatry: Judgement and insight appear normal. Mood & affect appears calm.    Data Reviewed: I have personally reviewed following labs and imaging studies  CBC: Recent Labs  Lab 05/01/18 1919 05/02/18 0459 05/03/18 0559  WBC 6.3 7.3 6.4  HGB 16.2 14.4 13.7  HCT 44.9 39.2 37.0*  MCV 92.4 93.1 93.0  PLT 290 267 230   Basic Metabolic Panel: Recent Labs  Lab 05/01/18 1919 05/02/18 0459 05/03/18 0559  NA 135 136 137  K 3.5 3.5 3.2*  CL 100 100 102  CO2 17* 24 24  GLUCOSE 141*  120* 108*  BUN <5* 5* <5*  CREATININE 0.77 0.81 0.88  CALCIUM 9.0 8.4* 8.7*  MG 1.5* 1.9  --   PHOS 3.4  --   --    Liver Function Tests: Recent Labs  Lab 05/01/18 1919 05/02/18 0459 05/03/18 0559  AST 165* 157* 171*  ALT 189* 159* 175*  ALKPHOS 44 38 43  BILITOT 0.8 1.2 1.3*  PROT 7.2 6.1* 6.2*  ALBUMIN 3.7 3.1* 3.0*   Coagulation Profile: Recent Labs  Lab  05/03/18 0559  INR 1.10          Radiology Studies: No results found.      Scheduled Meds: . folic acid  1 mg Oral Daily  . heparin  5,000 Units Subcutaneous Q8H  . [START ON 05/04/2018] LORazepam  0-4 mg Intravenous Q12H  . multivitamin with minerals  1 tablet Oral Daily  . nicotine  21 mg Transdermal Daily  . thiamine  100 mg Oral Daily   Continuous Infusions: . dextrose 5 % and 0.45% NaCl 100 mL/hr at 05/03/18 1738     LOS: 2 days     Sean ScottAnand Kathlen Sakurai, MD, FACP, [facility]. [facility] Pager [phone]  If 7PM-7AM, please contact night-coverage [email or web address] Password [email or web address] 05/03/2018, 6:04 PM

## 2018-05-04 ENCOUNTER — Inpatient Hospital Stay (HOSPITAL_COMMUNITY): Payer: Managed Care, Other (non HMO)

## 2018-05-04 DIAGNOSIS — I10 Essential (primary) hypertension: Secondary | ICD-10-CM

## 2018-05-04 DIAGNOSIS — F1010 Alcohol abuse, uncomplicated: Secondary | ICD-10-CM

## 2018-05-04 DIAGNOSIS — R740 Nonspecific elevation of levels of transaminase and lactic acid dehydrogenase [LDH]: Secondary | ICD-10-CM

## 2018-05-04 LAB — COMPREHENSIVE METABOLIC PANEL
ALT: 252 U/L — ABNORMAL HIGH (ref 0–44)
AST: 272 U/L — ABNORMAL HIGH (ref 15–41)
Albumin: 3 g/dL — ABNORMAL LOW (ref 3.5–5.0)
Alkaline Phosphatase: 45 U/L (ref 38–126)
Anion gap: 13 (ref 5–15)
BUN: <5 mg/dL — ABNORMAL LOW (ref 6–20)
CO2: 26 mmol/L (ref 22–32)
Calcium: 9.1 mg/dL (ref 8.9–10.3)
Chloride: 100 mmol/L (ref 98–111)
Creatinine, Ser: 0.83 mg/dL (ref 0.61–1.24)
GFR calc Af Amer: >60 mL/min (ref >60)
Glucose, Bld: 132 mg/dL — ABNORMAL HIGH (ref 70–99)
Potassium: 3.4 mmol/L — ABNORMAL LOW (ref 3.5–5.1)
Sodium: 139 mmol/L (ref 135–145)
Total Bilirubin: 1.1 mg/dL (ref 0.3–1.2)
Total Protein: 6.4 g/dL — ABNORMAL LOW (ref 6.5–8.1)

## 2018-05-04 LAB — HEPATIC FUNCTION PANEL
ALT: 251 U/L — ABNORMAL HIGH (ref 0–44)
AST: 276 U/L — ABNORMAL HIGH (ref 15–41)
Albumin: 3 g/dL — ABNORMAL LOW (ref 3.5–5.0)
Alkaline Phosphatase: 45 U/L (ref 38–126)
Bilirubin, Direct: 0.3 mg/dL — ABNORMAL HIGH (ref 0.0–0.2)
Indirect Bilirubin: 0.9 mg/dL (ref 0.3–0.9)
Total Bilirubin: 1.2 mg/dL (ref 0.3–1.2)
Total Protein: 6.2 g/dL — ABNORMAL LOW (ref 6.5–8.1)

## 2018-05-04 LAB — MAGNESIUM: Magnesium: 1.7 mg/dL (ref 1.7–2.4)

## 2018-05-04 MED ORDER — ENOXAPARIN SODIUM 40 MG/0.4ML ~~LOC~~ SOLN
40.0000 mg | SUBCUTANEOUS | Status: DC
  Administered 2018-05-05 – 2018-05-09 (×5): 40 mg via SUBCUTANEOUS
  Filled 2018-05-04 (×5): qty 0.4

## 2018-05-04 MED ORDER — IBUPROFEN 400 MG PO TABS
400.0000 mg | ORAL_TABLET | Freq: Once | ORAL | Status: AC
  Administered 2018-05-04: 400 mg via ORAL
  Filled 2018-05-04: qty 1

## 2018-05-04 MED ORDER — TRAMADOL HCL 50 MG PO TABS
50.0000 mg | ORAL_TABLET | Freq: Once | ORAL | Status: AC
  Administered 2018-05-04: 50 mg via ORAL
  Filled 2018-05-04: qty 1

## 2018-05-04 NOTE — Progress Notes (Signed)
PROGRESS NOTE    Sean Ibarra  ZOX:[id number] DOB: [birth year] DOA: 05/01/2018 PCP: Patient, No Pcp Per    Brief Narrative:  33 year old male who presented with alcohol withdrawal symptoms, he does have significant past medical history for severe alcohol abuse.  Apparently he tried to decrease his alcohol intake, triggering generalized body aches, nausea, chest pain and chills.  On his initial physical examination temperature 98.7, blood pressure 179/125, heart rate 117, respiratory rate 25, oxygen saturation 97%.  His mucous membranes are moist, lungs clear to auscultation bilaterally, heart S1-S2 present rhythmic, abdomen soft nontender, no lower extremity edema.   Patient was admitted to the hospital with working diagnosis of acute alcohol withdrawal complicated with hypertension  Assessment & Plan:   Principal Problem:   Delirium tremens (HCC) Active Problems:   Alcohol abuse   Tobacco abuse   Benign essential HTN   Sinus tachycardia   1. Acute alcohol withdrawal. Patient is more calm, less tremors, but not back to baseline, continue to have hallucinations, no nausea or vomiting. Will continue lorazepam as needed per CIWA protocol, multivitamins and thiamine. Social services consulted for outpatient alcohol rehab.   2. HTN. Will continue blood pressure monitoring.  3. Tobacco abuse. Will continue smoking cessation.   4. Elevated LFT. Possible alcohol induces, will continue to trend enzymes in am, patient with no signs of liver failure, encephalopathy, no coagulopathy.   DVT prophylaxis: enoxaparin   Code Status: full Family Communication: no family at the bedside  Disposition Plan/ discharge barriers: possible dc in am.   Body mass index is 38.09 kg/m. Malnutrition Type:      Malnutrition Characteristics:      Nutrition Interventions:     RN Pressure Injury Documentation:     Consultants:     Procedures:     Antimicrobials:        Subjective: Patient is feeling better but not back to baseline, positive tremors and occasional hallucinations, no nausea or vomiting, no fever or chills.   Objective: Vitals:   05/03/18 0437 05/03/18 0935 05/03/18 2228 05/04/18 0448  BP: (!) 137/102 (!) 142/100 116/68 (!) 124/91  Pulse: (!) 102 88 94 97  Resp: 18 20 (!) 21 18  Temp: 98.8 F (37.1 C) 99.1 F (37.3 C) 99.4 F (37.4 C) 98 F (36.7 C)  TempSrc: Oral Oral Oral   SpO2: 98% 98% 96% 97%  Weight:      Height:        Intake/Output Summary (Last 24 hours) at 05/04/2018 0920 Last data filed at 05/04/2018 [id number] Gross per 24 hour  Intake 480 ml  Output 3400 ml  Net -2920 ml   Filed Weights   05/02/18 1148  Weight: 127.4 kg    Examination:   General: not agitated.  Neurology: Awake and alert, non focal/mild distal tremors.   E ENT: mild pallor, no icterus, oral mucosa moist Cardiovascular: No JVD. S1-S2 present, rhythmic, no gallops, rubs, or murmurs. No lower extremity edema. Pulmonary: vesicular breath sounds bilaterally, adequate air movement, no wheezing, rhonchi or rales. Gastrointestinal. Abdomen , no organomegaly, non tender, no rebound or guarding Skin. No rashes Musculoskeletal: no joint deformities     Data Reviewed: I have personally reviewed following labs and imaging studies  CBC: Recent Labs  Lab 05/01/18 1919 05/02/18 0459 05/03/18 0559  WBC 6.3 7.3 6.4  HGB 16.2 14.4 13.7  HCT 44.9 39.2 37.0*  MCV 92.4 93.1 93.0  PLT 290 267 230   Basic Metabolic Panel: Recent Labs  Lab 05/01/18 1919 05/02/18 0459 05/03/18 0559 05/04/18 0540  NA 135 136 137 139  K 3.5 3.5 3.2* 3.4*  CL 100 100 102 100  CO2 17* 24 24 26   GLUCOSE 141* 120* 108* 132*  BUN <5* 5* <5* <5*  CREATININE 0.77 0.81 0.88 0.83  CALCIUM 9.0 8.4* 8.7* 9.1  MG 1.5* 1.9  --  1.7  PHOS 3.4  --   --   --    GFR: Estimated Creatinine Clearance: 172.9 mL/min (by C-G formula based on SCr of 0.83 mg/dL). Liver Function  Tests: Recent Labs  Lab 05/01/18 1919 05/02/18 0459 05/03/18 0559 05/04/18 0540  AST 165* 157* 171* 272*  ALT 189* 159* 175* 252*  ALKPHOS 44 38 43 45  BILITOT 0.8 1.2 1.3* 1.1  PROT 7.2 6.1* 6.2* 6.4*  ALBUMIN 3.7 3.1* 3.0* 3.0*   No results for input(s): LIPASE, AMYLASE in the last 168 hours. No results for input(s): AMMONIA in the last 168 hours. Coagulation Profile: Recent Labs  Lab 05/03/18 0559  INR 1.10   Cardiac Enzymes: No results for input(s): CKTOTAL, CKMB, CKMBINDEX, TROPONINI in the last 168 hours. BNP (last 3 results) No results for input(s): PROBNP in the last 8760 hours. HbA1C: No results for input(s): HGBA1C in the last 72 hours. CBG: No results for input(s): GLUCAP in the last 168 hours. Lipid Profile: No results for input(s): CHOL, HDL, LDLCALC, TRIG, CHOLHDL, LDLDIRECT in the last 72 hours. Thyroid Function Tests: No results for input(s): TSH, T4TOTAL, FREET4, T3FREE, THYROIDAB in the last 72 hours. Anemia Panel: No results for input(s): VITAMINB12, FOLATE, FERRITIN, TIBC, IRON, RETICCTPCT in the last 72 hours.    Radiology Studies: I have reviewed all of the imaging during this hospital visit personally     Scheduled Meds: . folic acid  1 mg Oral Daily  . heparin  5,000 Units Subcutaneous Q8H  . LORazepam  0-4 mg Intravenous Q12H  . multivitamin with minerals  1 tablet Oral Daily  . nicotine  21 mg Transdermal Daily  . thiamine  100 mg Oral Daily   Continuous Infusions:   LOS: 3 days        Latarshia Jersey Annett Gulaaniel Shadd Dunstan, MD [facility] Pager [phone]'

## 2018-05-04 NOTE — Care Management Note (Signed)
Case Management Note Hortencia ConradiWendi Cashel Bellina, RN MSN CCM Transitions of Care [place] [id number]  Patient Details  Name: Brigid ReMichael Hommes MRN: [id number] Date of Birth: May 21, 1985  Subjective/Objective:       Delirium tremens           Action/Plan: PTA home. Independent. [place], [place]. [place] and that he has insurance. Discussed that [employer] can assist with finding an inn PCP. Patient stated that his plans are to check himself into a detox facility. CM advised that [employer] could assist him with this as well. Will continue to follow for transition of care needs.   Expected Discharge Date:                  Expected Discharge Plan:  Home/Self Care  In-House Referral:  Financial Counselor  Discharge planning Services  CM Consult  Post Acute Care Choice:  NA Choice offered to:  NA  DME Arranged:  N/A DME Agency:  NA  HH Arranged:  NA HH Agency:  NA  Status of Service:  In process, will continue to follow  If discussed at Long Length of Stay Meetings, dates discussed:    Additional Comments:  Bess KindsWendi B Rasmus Preusser, RN 05/04/2018, 3:09 PM

## 2018-05-05 DIAGNOSIS — M1990 Unspecified osteoarthritis, unspecified site: Secondary | ICD-10-CM

## 2018-05-05 LAB — BASIC METABOLIC PANEL
Anion gap: 13 (ref 5–15)
BUN: 5 mg/dL — ABNORMAL LOW (ref 6–20)
CO2: 24 mmol/L (ref 22–32)
Calcium: 9.4 mg/dL (ref 8.9–10.3)
Chloride: 99 mmol/L (ref 98–111)
Creatinine, Ser: 0.75 mg/dL (ref 0.61–1.24)
GFR calc Af Amer: >60 mL/min (ref >60)
Glucose, Bld: 130 mg/dL — ABNORMAL HIGH (ref 70–99)
Potassium: 3.6 mmol/L (ref 3.5–5.1)
Sodium: 136 mmol/L (ref 135–145)

## 2018-05-05 MED ORDER — KETOROLAC TROMETHAMINE 30 MG/ML IJ SOLN
30.0000 mg | Freq: Four times a day (QID) | INTRAMUSCULAR | Status: DC | PRN
  Administered 2018-05-05 (×3): 30 mg via INTRAVENOUS
  Filled 2018-05-05 (×3): qty 1

## 2018-05-05 MED ORDER — MORPHINE SULFATE (PF) 2 MG/ML IV SOLN
2.0000 mg | INTRAVENOUS | Status: DC | PRN
  Administered 2018-05-05 – 2018-05-06 (×8): 2 mg via INTRAVENOUS
  Filled 2018-05-05 (×9): qty 1

## 2018-05-05 NOTE — Progress Notes (Signed)
PROGRESS NOTE    Sean Ibarra  JJH:[id number] DOB: 26-Apr-1985 DOA: 05/01/2018 PCP: Patient, No Pcp Per    Brief Narrative:  33 year old male who presented with alcohol withdrawal symptoms, he does have significant past medical history for severe alcohol abuse.  Apparently he tried to decrease his alcohol intake, triggering generalized body aches, nausea, chest pain and chills.  On his initial physical examination temperature 98.7, blood pressure 179/125, heart rate 117, respiratory rate 25, oxygen saturation 97%.  His mucous membranes are moist, lungs clear to auscultation bilaterally, heart S1-S2 present rhythmic, abdomen soft nontender, no lower extremity edema.   Patient was admitted to the hospital with working diagnosis of acute alcohol withdrawal complicated with hypertension   Assessment & Plan:   Principal Problem:   Delirium tremens (HCC) Active Problems:   Alcohol abuse   Tobacco abuse   Benign essential HTN   Sinus tachycardia  1. Acute right elbow arthritis. New joint pain, very tender to palpation, doubt infectious, possible crystal induces, gout. Will add anti-inflammatory therapy with NSAID IV ketorolac, po ibuprofen. Will check blood cultures, cell count and temperature curve, will have low threshold to start patient with antibiotic therapy.   1. Acute alcohol withdrawal. Tolerating well lorazepam per CIWA protocol, multivitamins and thiamine. Will need outpatient follow up. Tremors continue to improve but not back to baseline.   2. HTN. Blood pressure systolic 130 to 150 mmHg, patient has been in pain.   3. Tobacco abuse. smoking cessation counseling.   4. Elevated LFT. Possible alcohol induced, will check LFT in am. Patient with no encephalopathy and tolerating po well.  DVT prophylaxis: enoxaparin   Code Status: full Family Communication: no family at the bedside  Disposition Plan/ discharge barriers: possible dc in am.    Body mass index is 38.09  kg/m. Malnutrition Type:      Malnutrition Characteristics:      Nutrition Interventions:     RN Pressure Injury Documentation:     Consultants:     Procedures:     Antimicrobials:       Subjective: Patient has developed new right ankle pain, severe in intensity, 10/10. Sharp in nature, worse with movement, no improving factors, associated with fever. No nausea or vomiting.   Objective: Vitals:   05/04/18 1858 05/04/18 2245 05/05/18 0606 05/05/18 0922  BP: (!) 157/94 (!) 151/120 134/88 132/68  Pulse: (!) 108 (!) 122 (!) 111 (!) 115  Resp: 18 20 20 16   Temp: 99.6 F (37.6 C) 98.6 F (37 C) (!) 100.4 F (38 C) 100.3 F (37.9 C)  TempSrc: Oral Oral Oral Oral  SpO2: 96% 98% 97% 95%  Weight:      Height:        Intake/Output Summary (Last 24 hours) at 05/05/2018 1028 Last data filed at 05/05/2018 0827 Gross per 24 hour  Intake 480 ml  Output 800 ml  Net -320 ml   Filed Weights   05/02/18 1148  Weight: 127.4 kg    Examination:   General: deconditioned and in pain.  Neurology: Awake and alert, non focal  E ENT: mild pallor, no icterus, oral mucosa moist Cardiovascular: No JVD. S1-S2 present, rhythmic, no gallops, rubs, or murmurs. No lower extremity edema. Pulmonary: vesicular breath sounds bilaterally, adequate air movement, no wheezing, rhonchi or rales. Gastrointestinal. Abdomen with no organomegaly, non tender, no rebound or guarding Skin. Right elbow erythema, no edema.  Musculoskeletal: right elbow tender to palpation, increased local temperature and decreased range of motion.  Data Reviewed: I have personally reviewed following labs and imaging studies  CBC: Recent Labs  Lab 05/01/18 1919 05/02/18 0459 05/03/18 0559  WBC 6.3 7.3 6.4  HGB 16.2 14.4 13.7  HCT 44.9 39.2 37.0*  MCV 92.4 93.1 93.0  PLT 290 267 230   Basic Metabolic Panel: Recent Labs  Lab 05/01/18 1919 05/02/18 0459 05/03/18 0559 05/04/18 0540  05/05/18 0708  NA 135 136 137 139 136  K 3.5 3.5 3.2* 3.4* 3.6  CL 100 100 102 100 99  CO2 17* 24 24 26 24   GLUCOSE 141* 120* 108* 132* 130*  BUN <5* 5* <5* <5* 5*  CREATININE 0.77 0.81 0.88 0.83 0.75  CALCIUM 9.0 8.4* 8.7* 9.1 9.4  MG 1.5* 1.9  --  1.7  --   PHOS 3.4  --   --   --   --    GFR: Estimated Creatinine Clearance: 179.4 mL/min (by C-G formula based on SCr of 0.75 mg/dL). Liver Function Tests: Recent Labs  Lab 05/01/18 1919 05/02/18 0459 05/03/18 0559 05/04/18 0540  AST 165* 157* 171* 276*  272*  ALT 189* 159* 175* 251*  252*  ALKPHOS 44 38 43 45  45  BILITOT 0.8 1.2 1.3* 1.2  1.1  PROT 7.2 6.1* 6.2* 6.2*  6.4*  ALBUMIN 3.7 3.1* 3.0* 3.0*  3.0*   No results for input(s): LIPASE, AMYLASE in the last 168 hours. No results for input(s): AMMONIA in the last 168 hours. Coagulation Profile: Recent Labs  Lab 05/03/18 0559  INR 1.10   Cardiac Enzymes: No results for input(s): CKTOTAL, CKMB, CKMBINDEX, TROPONINI in the last 168 hours. BNP (last 3 results) No results for input(s): PROBNP in the last 8760 hours. HbA1C: No results for input(s): HGBA1C in the last 72 hours. CBG: No results for input(s): GLUCAP in the last 168 hours. Lipid Profile: No results for input(s): CHOL, HDL, LDLCALC, TRIG, CHOLHDL, LDLDIRECT in the last 72 hours. Thyroid Function Tests: No results for input(s): TSH, T4TOTAL, FREET4, T3FREE, THYROIDAB in the last 72 hours. Anemia Panel: No results for input(s): VITAMINB12, FOLATE, FERRITIN, TIBC, IRON, RETICCTPCT in the last 72 hours.    Radiology Studies: I have reviewed all of the imaging during this hospital visit personally     Scheduled Meds: . enoxaparin (LOVENOX) injection  40 mg Subcutaneous Q24H  . folic acid  1 mg Oral Daily  . LORazepam  0-4 mg Intravenous Q12H  . multivitamin with minerals  1 tablet Oral Daily  . nicotine  21 mg Transdermal Daily  . thiamine  100 mg Oral Daily   Continuous Infusions:   LOS:  4 days        Mauricio Annett Gula, MD [facility] Pager [phone]

## 2018-05-05 NOTE — Clinical Social Work Note (Signed)
SW intern completed SBIRT with Sean Ibarra. Patient scored a 35 on the SBIRT tool, making him high risk. SW intern talked with patient about his daily drinking in which he states it would be impossible for him to quit on his own. He reportedly drinks 1/5th of whisky on work days and 1/2 gallon on days that he does not work. Sean Ibarra has been off of work for the past 3 weeks for the holidays and has been drinking 24/7 of the 3 weeks. Patient stated that he would drink until he vomited, then would keep drinking. When it was time for him to go back to work, his boss would not let him work until he received some help for his drinking. 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 Patient also disclosed that he received a DUI last year which has hurt him. Sean Ibarra is ready to receive help with his drinking and stated that alcohol has ruined his life. SW intern provided information of treatment facilities in the area that have detox, inpatient, and outpatient options. Patient stated that he will review this with his boss and wants to go to a treatment facility after discharge.

## 2018-05-05 NOTE — Progress Notes (Signed)
Patient given Toradol as prescribed by MD. Patient states that Toradol is not helping. MD notified. Arrien, MD stated that he would order Morphine. Awaiting orders. Will continue to monitor.

## 2018-05-06 DIAGNOSIS — M10021 Idiopathic gout, right elbow: Secondary | ICD-10-CM

## 2018-05-06 DIAGNOSIS — R000 Tachycardia, unspecified: Secondary | ICD-10-CM

## 2018-05-06 LAB — HEPATIC FUNCTION PANEL
ALT: 186 U/L — AB (ref 0–44)
AST: 91 U/L — AB (ref 15–41)
Albumin: 3.1 g/dL — ABNORMAL LOW (ref 3.5–5.0)
Alkaline Phosphatase: 41 U/L (ref 38–126)
Bilirubin, Direct: 0.3 mg/dL — ABNORMAL HIGH (ref 0.0–0.2)
Indirect Bilirubin: 1.1 mg/dL — ABNORMAL HIGH (ref 0.3–0.9)
Total Bilirubin: 1.4 mg/dL — ABNORMAL HIGH (ref 0.3–1.2)
Total Protein: 7.1 g/dL (ref 6.5–8.1)

## 2018-05-06 LAB — BASIC METABOLIC PANEL
Anion gap: 12 (ref 5–15)
BUN: 9 mg/dL (ref 6–20)
CO2: 26 mmol/L (ref 22–32)
Calcium: 9.3 mg/dL (ref 8.9–10.3)
Chloride: 99 mmol/L (ref 98–111)
Creatinine, Ser: 0.77 mg/dL (ref 0.61–1.24)
GFR calc Af Amer: >60 mL/min (ref >60)
GFR calc non Af Amer: >60 mL/min (ref >60)
Glucose, Bld: 118 mg/dL — ABNORMAL HIGH (ref 70–99)
Potassium: 3.8 mmol/L (ref 3.5–5.1)
SODIUM: 137 mmol/L (ref 135–145)

## 2018-05-06 LAB — CBC WITH DIFFERENTIAL/PLATELET
ABS IMMATURE GRANULOCYTES: 0.11 10*3/uL — AB (ref 0.00–0.07)
Basophils Absolute: 0.1 10*3/uL (ref 0.0–0.1)
Basophils Relative: 1 %
Eosinophils Absolute: 0.1 10*3/uL (ref 0.0–0.5)
Eosinophils Relative: 1 %
HCT: 40.2 % (ref 39.0–52.0)
Hemoglobin: 14.4 g/dL (ref 13.0–17.0)
Immature Granulocytes: 1 %
Lymphocytes Relative: 24 %
Lymphs Abs: 2.3 10*3/uL (ref 0.7–4.0)
MCH: 34.5 pg — AB (ref 26.0–34.0)
MCHC: 35.8 g/dL (ref 30.0–36.0)
MCV: 96.4 fL (ref 80.0–100.0)
MONO ABS: 1.2 10*3/uL — AB (ref 0.1–1.0)
Monocytes Relative: 12 %
Neutro Abs: 5.6 10*3/uL (ref 1.7–7.7)
Neutrophils Relative %: 61 %
Platelets: 271 10*3/uL (ref 150–400)
RBC: 4.17 MIL/uL — ABNORMAL LOW (ref 4.22–5.81)
RDW: 12.9 % (ref 11.5–15.5)
WBC: 9.3 10*3/uL (ref 4.0–10.5)
nRBC: 0 % (ref 0.0–0.2)

## 2018-05-06 LAB — URIC ACID: Uric Acid, Serum: 8.4 mg/dL (ref 3.7–8.6)

## 2018-05-06 MED ORDER — PANTOPRAZOLE SODIUM 40 MG PO TBEC
40.0000 mg | DELAYED_RELEASE_TABLET | Freq: Every day | ORAL | Status: DC
  Administered 2018-05-06 – 2018-05-10 (×5): 40 mg via ORAL
  Filled 2018-05-06 (×5): qty 1

## 2018-05-06 MED ORDER — MORPHINE SULFATE (PF) 4 MG/ML IV SOLN
4.0000 mg | INTRAVENOUS | Status: DC | PRN
  Administered 2018-05-06 – 2018-05-08 (×17): 4 mg via INTRAVENOUS
  Filled 2018-05-06 (×17): qty 1

## 2018-05-06 MED ORDER — KETOROLAC TROMETHAMINE 30 MG/ML IJ SOLN
30.0000 mg | Freq: Three times a day (TID) | INTRAMUSCULAR | Status: DC
  Administered 2018-05-06 – 2018-05-08 (×7): 30 mg via INTRAVENOUS
  Filled 2018-05-06 (×7): qty 1

## 2018-05-06 MED ORDER — COLCHICINE 0.6 MG PO TABS
0.6000 mg | ORAL_TABLET | Freq: Three times a day (TID) | ORAL | Status: DC
  Administered 2018-05-06 – 2018-05-10 (×12): 0.6 mg via ORAL
  Filled 2018-05-06 (×12): qty 1

## 2018-05-06 MED ORDER — KETOROLAC TROMETHAMINE 30 MG/ML IJ SOLN: 30.0000 mg | Freq: Three times a day (TID) | INTRAMUSCULAR | Status: DC

## 2018-05-06 MED ORDER — SENNOSIDES-DOCUSATE SODIUM 8.6-50 MG PO TABS
2.0000 | ORAL_TABLET | Freq: Once | ORAL | Status: AC
  Administered 2018-05-06: 2 via ORAL
  Filled 2018-05-06: qty 2

## 2018-05-06 NOTE — Progress Notes (Signed)
PROGRESS NOTE    Sean Ibarra  ZOX:[id number] DOB: 1986/02/11 DOA: 05/01/2018 PCP: Patient, No Pcp Per    Brief Narrative:  33 year old male who presented with alcohol withdrawal symptoms, he does have significant past medical history for severe alcohol abuse.Apparently he tried to decrease his alcohol intake, triggering generalized body aches, nausea, chest pain and chills. On his initial physical examination temperature 98.7, blood pressure 179/125, heart rate 117, respiratory rate 25, oxygen saturation 97%.His mucous membranes are moist, lungs clear to auscultation bilaterally, heart S1-S2 present rhythmic, abdomen soft nontender, no lower extremity edema.  Patient was admitted to the hospital with working diagnosis of acute alcohol withdrawal complicated with hypertension   Assessment & Plan:   Principal Problem:   Delirium tremens (HCC) Active Problems:   Alcohol abuse   Tobacco abuse   Benign essential HTN   Sinus tachycardia   1. Acute right elbow arthritis. Patient continue to have significant right elbow pain, worse with movement. He has not had any fever today or leukocytosis, suspected inflammatory arthritis, possibly gout. Will continue IV ketorolac now scheduled every 6 H and will increase morphine to 4 mg q2 H as needed. Note that his uric acid is 8,4. Will add tid colchicine.   1. Acute alcohol withdrawal.Continue with lorazepam per CIWA protocol, along with multivitamins and thiamine. Clinically continue to improve, tolerating po well, no nausea or vomiting, today with no tremors or hallucinations.   2. HTN. Stable blood pressure off antihypertensive meds.  3. Tobacco abuse. Continue with smoking cessation counseling.   4. Elevated LFT. Continue to improve parameters with ast at 91, alt at 186. Patient tolerating po well, counseling avoid alcohol consumption cessation.   DVT prophylaxis: enoxaparin   Code Status: full Family Communication: no family at  the bedside  Disposition Plan/ discharge barriers: pending clinical improvement.   Body mass index is 38.27 kg/m. Malnutrition Type:      Malnutrition Characteristics:      Nutrition Interventions:     RN Pressure Injury Documentation:     Consultants:     Procedures:     Antimicrobials:       Subjective: Patient continue to have pain on his right shoulder, worse with movement, sharp in nature, up to 10/10 in intensity with no radiation, associated with local edema and erythema.   Objective: Vitals:   05/05/18 0922 05/05/18 1759 05/05/18 2020 05/06/18 0834  BP: 132/68 (!) 141/89 (!) 142/113 135/89  Pulse: (!) 115 97 98 (!) 113  Resp: 16 18 (!) 22 19  Temp: 100.3 F (37.9 C) 98.9 F (37.2 C) 97.9 F (36.6 C) (!) 100.8 F (38.2 C)  TempSrc: Oral Oral Oral Oral  SpO2: 95% 95% 97% 95%  Weight:   128 kg   Height:        Intake/Output Summary (Last 24 hours) at 05/06/2018 0838 Last data filed at 05/05/2018 2100 Gross per 24 hour  Intake 560 ml  Output 0 ml  Net 560 ml   Filed Weights   05/02/18 1148 05/05/18 2020  Weight: 127.4 kg 128 kg    Examination:   General: deconditioned and in pain  Neurology: Awake and alert, non focal, no tremors.  E ENT: no pallor, no icterus, oral mucosa moist Cardiovascular: No JVD. S1-S2 present, rhythmic, no gallops, rubs, or murmurs. No lower extremity edema. Pulmonary: vesicular breath sounds bilaterally, adequate air movement, no wheezing, rhonchi or rales. Gastrointestinal. Abdomen with no organomegaly, non tender, no rebound or guarding Skin. No rashes Musculoskeletal:  right elbow with significant edema and erythema, range of motion has improved from yesterday but still very limited. Increased local temperature.      Data Reviewed: I have personally reviewed following labs and imaging studies  CBC: Recent Labs  Lab 05/01/18 1919 05/02/18 0459 05/03/18 0559 05/06/18 0332  WBC 6.3 7.3 6.4 9.3    NEUTROABS  --   --   --  5.6  HGB 16.2 14.4 13.7 14.4  HCT 44.9 39.2 37.0* 40.2  MCV 92.4 93.1 93.0 96.4  PLT 290 267 230 271   Basic Metabolic Panel: Recent Labs  Lab 05/01/18 1919 05/02/18 0459 05/03/18 0559 05/04/18 0540 05/05/18 0708 05/06/18 0332  NA 135 136 137 139 136 137  K 3.5 3.5 3.2* 3.4* 3.6 3.8  CL 100 100 102 100 99 99  CO2 17* 24 24 26 24 26   GLUCOSE 141* 120* 108* 132* 130* 118*  BUN <5* 5* <5* <5* 5* 9  CREATININE 0.77 0.81 0.88 0.83 0.75 0.77  CALCIUM 9.0 8.4* 8.7* 9.1 9.4 9.3  MG 1.5* 1.9  --  1.7  --   --   PHOS 3.4  --   --   --   --   --    GFR: Estimated Creatinine Clearance: 180 mL/min (by C-G formula based on SCr of 0.77 mg/dL). Liver Function Tests: Recent Labs  Lab 05/01/18 1919 05/02/18 0459 05/03/18 0559 05/04/18 0540 05/06/18 0332  AST 165* 157* 171* 276*  272* 91*  ALT 189* 159* 175* 251*  252* 186*  ALKPHOS 44 38 43 45  45 41  BILITOT 0.8 1.2 1.3* 1.2  1.1 1.4*  PROT 7.2 6.1* 6.2* 6.2*  6.4* 7.1  ALBUMIN 3.7 3.1* 3.0* 3.0*  3.0* 3.1*   No results for input(s): LIPASE, AMYLASE in the last 168 hours. No results for input(s): AMMONIA in the last 168 hours. Coagulation Profile: Recent Labs  Lab 05/03/18 0559  INR 1.10   Cardiac Enzymes: No results for input(s): CKTOTAL, CKMB, CKMBINDEX, TROPONINI in the last 168 hours. BNP (last 3 results) No results for input(s): PROBNP in the last 8760 hours. HbA1C: No results for input(s): HGBA1C in the last 72 hours. CBG: No results for input(s): GLUCAP in the last 168 hours. Lipid Profile: No results for input(s): CHOL, HDL, LDLCALC, TRIG, CHOLHDL, LDLDIRECT in the last 72 hours. Thyroid Function Tests: No results for input(s): TSH, T4TOTAL, FREET4, T3FREE, THYROIDAB in the last 72 hours. Anemia Panel: No results for input(s): VITAMINB12, FOLATE, FERRITIN, TIBC, IRON, RETICCTPCT in the last 72 hours.    Radiology Studies: I have reviewed all of the imaging during this  hospital visit personally     Scheduled Meds: . enoxaparin (LOVENOX) injection  40 mg Subcutaneous Q24H  . folic acid  1 mg Oral Daily  . multivitamin with minerals  1 tablet Oral Daily  . nicotine  21 mg Transdermal Daily  . thiamine  100 mg Oral Daily   Continuous Infusions:   LOS: 5 days        Jamye Balicki Annett Gula, MD [facility] Pager [phone]

## 2018-05-06 NOTE — Care Management Note (Signed)
Case Management Note Hortencia Conradi, RN MSN CCM Transitions of Care [facility] [place] [phone]  Patient Details  Name: Sean Ibarra MRN: [id number] Date of Birth: Jul 31, 1985  Subjective/Objective:       Delirium tremens           Action/Plan: PTA home. Independent. Discussed address in [place], [place]. States that he stays in [place] and that he has insurance. Discussed that Rosann Auerbach can assist with finding an inn PCP. Patient stated that his plans are to check himself into a detox facility. CM advised that [employer] could assist him with this as well. Will continue to follow for transition of care needs.   Expected Discharge Date:                  Expected Discharge Plan:  Home/Self Care  In-House Referral:  Financial Counselor  Discharge planning Services  CM Consult  Post Acute Care Choice:  NA Choice offered to:  NA  DME Arranged:  N/A DME Agency:  NA  HH Arranged:  NA HH Agency:  NA  Status of Service:  In process, will continue to follow  If discussed at Long Length of Stay Meetings, dates discussed:    Additional Comments: 05/06/2018-Received call [phone] from Lupita Leash at [facility]. Lupita Leash has made referral for a [facility] CM to reach to patient and guide him through process of following up with inpatient treatment center and to assist with finding inn PCP. Provided number [phone] to confirm inn status for [facility]. Spoke with patient at bedside to advise of pending call and of inn status for [facility]. Bedside RN assisted patient with signing consent for [facility] to request records. Will continue to follow for transition of care needs.   Bess Kinds, RN 05/06/2018, 1:46 PM

## 2018-05-07 LAB — BASIC METABOLIC PANEL
Anion gap: 13 (ref 5–15)
BUN: 11 mg/dL (ref 6–20)
CHLORIDE: 99 mmol/L (ref 98–111)
CO2: 26 mmol/L (ref 22–32)
Calcium: 9.4 mg/dL (ref 8.9–10.3)
Creatinine, Ser: 0.73 mg/dL (ref 0.61–1.24)
GFR calc Af Amer: >60 mL/min (ref >60)
GFR calc non Af Amer: >60 mL/min (ref >60)
Glucose, Bld: 101 mg/dL — ABNORMAL HIGH (ref 70–99)
Potassium: 4.1 mmol/L (ref 3.5–5.1)
Sodium: 138 mmol/L (ref 135–145)

## 2018-05-07 LAB — CBC WITH DIFFERENTIAL/PLATELET
Abs Immature Granulocytes: 0.07 10*3/uL (ref 0.00–0.07)
Basophils Absolute: 0.1 10*3/uL (ref 0.0–0.1)
Basophils Relative: 1 %
Eosinophils Absolute: 0.2 10*3/uL (ref 0.0–0.5)
Eosinophils Relative: 3 %
HCT: 41.9 % (ref 39.0–52.0)
HEMOGLOBIN: 14.6 g/dL (ref 13.0–17.0)
Immature Granulocytes: 1 %
LYMPHS PCT: 28 %
Lymphs Abs: 2.4 10*3/uL (ref 0.7–4.0)
MCH: 34.3 pg — ABNORMAL HIGH (ref 26.0–34.0)
MCHC: 34.8 g/dL (ref 30.0–36.0)
MCV: 98.4 fL (ref 80.0–100.0)
Monocytes Absolute: 1 10*3/uL (ref 0.1–1.0)
Monocytes Relative: 11 %
Neutro Abs: 4.9 10*3/uL (ref 1.7–7.7)
Neutrophils Relative %: 56 %
Platelets: 287 10*3/uL (ref 150–400)
RBC: 4.26 MIL/uL (ref 4.22–5.81)
RDW: 12.7 % (ref 11.5–15.5)
WBC: 8.7 10*3/uL (ref 4.0–10.5)
nRBC: 0 % (ref 0.0–0.2)

## 2018-05-07 MED ORDER — PREDNISONE 20 MG PO TABS
20.0000 mg | ORAL_TABLET | Freq: Once | ORAL | Status: AC
  Administered 2018-05-07: 20 mg via ORAL
  Filled 2018-05-07: qty 1

## 2018-05-07 NOTE — Progress Notes (Signed)
Patient ID: Sean Ibarra, male   DOB: September 22, 1985, 33 y.o.   MRN: [id number] After further thought, I did cancel the MRI order for his right elbow.  I am going to give him an oral dose of prednisone today to see how he responds.

## 2018-05-07 NOTE — Consult Note (Addendum)
Reason for Consult:right elbow swelling and pain Referring Physician: Ella JubileeArrien, Sean CoasterMauricio  Sean Ibarra is an 33 y.o. male.  HPI: Patient admitted withdrawal symptoms for alcohol abuse. States no injury to right elbow. Denies any IV drug use. Notes elbow pain and swelling began during this hospital admission 05/03/2018. Denies any fevers or chills.  Denies history of gout. States pain in elbow slightly improved with Colchicine, has only had 2 doses during hospitalization.   History reviewed. No pertinent past medical history.  History reviewed. No pertinent surgical history.  No family history on file.  Social History:  reports that he has quit smoking. His smoking use included cigarettes. He smoked 0.50 packs per day. He has never used smokeless tobacco. He reports that he does not use drugs. No history on file for alcohol.  Allergies: No Known Allergies  Medications: I have reviewed the patient's current medications.  Results for orders placed or performed during the hospital encounter of 05/01/18 (from the past 48 hour(s))  Culture, blood (routine x 2)     Status: None (Preliminary result)   Collection Time: 05/05/18  4:30 PM  Result Value Ref Range   Specimen Description BLOOD LEFT HAND    Special Requests      BOTTLES DRAWN AEROBIC AND ANAEROBIC Blood Culture adequate volume   Culture      NO GROWTH < 24 HOURS Performed at [facility], [address] [place], [place] [address]    Report Status PENDING   Culture, blood (routine x 2)     Status: None (Preliminary result)   Collection Time: 05/05/18  4:35 PM  Result Value Ref Range   Specimen Description BLOOD RIGHT HAND    Special Requests      BOTTLES DRAWN AEROBIC AND ANAEROBIC Blood Culture adequate volume   Culture      NO GROWTH < 24 HOURS Performed at [facility], [address] [place], [place] [address]    Report Status PENDING   Basic metabolic panel     Status: Abnormal   Collection Time: 05/06/18   3:32 AM  Result Value Ref Range   Sodium 137 135 - 145 mmol/L   Potassium 3.8 3.5 - 5.1 mmol/L   Chloride 99 98 - 111 mmol/L   CO2 26 22 - 32 mmol/L   Glucose, Bld 118 (H) 70 - 99 mg/dL   BUN 9 6 - 20 mg/dL   Creatinine, Ser 6.210.77 0.61 - 1.24 mg/dL   Calcium 9.3 8.9 - 30.810.3 mg/dL   GFR calc non Af Amer >60 >60 mL/min   GFR calc Af Amer >60 >60 mL/min   Anion gap 12 5 - 15    Comment: Performed at [facility], [address] [place], [place] [address]  CBC with Differential/Platelet     Status: Abnormal   Collection Time: 05/06/18  3:32 AM  Result Value Ref Range   WBC 9.3 4.0 - 10.5 K/uL   RBC 4.17 (L) 4.22 - 5.81 MIL/uL   Hemoglobin 14.4 13.0 - 17.0 g/dL   HCT 69.640.2 29.539.0 - 28.452.0 %   MCV 96.4 80.0 - 100.0 fL   MCH 34.5 (H) 26.0 - 34.0 pg   MCHC 35.8 30.0 - 36.0 g/dL   RDW 13.212.9 44.011.5 - 10.215.5 %   Platelets 271 150 - 400 K/uL   nRBC 0.0 0.0 - 0.2 %   Neutrophils Relative % 61 %   Neutro Abs 5.6 1.7 - 7.7 K/uL  Lymphocytes Relative 24 %   Lymphs Abs 2.3 0.7 - 4.0 K/uL   Monocytes Relative 12 %   Monocytes Absolute 1.2 (H) 0.1 - 1.0 K/uL   Eosinophils Relative 1 %   Eosinophils Absolute 0.1 0.0 - 0.5 K/uL   Basophils Relative 1 %   Basophils Absolute 0.1 0.0 - 0.1 K/uL   Immature Granulocytes 1 %   Abs Immature Granulocytes 0.11 (H) 0.00 - 0.07 K/uL    Comment: Performed at [facility], [address], [place], [place] [address]  Uric acid     Status: None   Collection Time: 05/06/18  3:32 AM  Result Value Ref Range   Uric Acid, Serum 8.4 3.7 - 8.6 mg/dL    Comment: Performed at [facility], [address], [place], [place] [address]  Hepatic function panel     Status: Abnormal   Collection Time: 05/06/18  3:32 AM  Result Value Ref Range   Total Protein 7.1 6.5 - 8.1 g/dL   Albumin 3.1 (L) 3.5 - 5.0 g/dL   AST 91 (H) 15 - 41 U/L   ALT 186 (H) 0 - 44 U/L   Alkaline Phosphatase 41 38 - 126 U/L   Total Bilirubin 1.4 (H) 0.3 - 1.2 mg/dL   Bilirubin,  Direct 0.3 (H) 0.0 - 0.2 mg/dL   Indirect Bilirubin 1.1 (H) 0.3 - 0.9 mg/dL    Comment: Performed at [facility], [address] [place], [place] [address]  Basic metabolic panel     Status: Abnormal   Collection Time: 05/07/18  5:48 AM  Result Value Ref Range   Sodium 138 135 - 145 mmol/L   Potassium 4.1 3.5 - 5.1 mmol/L   Chloride 99 98 - 111 mmol/L   CO2 26 22 - 32 mmol/L   Glucose, Bld 101 (H) 70 - 99 mg/dL   BUN 11 6 - 20 mg/dL   Creatinine, Ser 2.62 0.61 - 1.24 mg/dL   Calcium 9.4 8.9 - 03.5 mg/dL   GFR calc non Af Amer >60 >60 mL/min   GFR calc Af Amer >60 >60 mL/min   Anion gap 13 5 - 15    Comment: Performed at [facility], [address], [place], [place] [address]  CBC with Differential/Platelet     Status: Abnormal   Collection Time: 05/07/18  5:48 AM  Result Value Ref Range   WBC 8.7 4.0 - 10.5 K/uL   RBC 4.26 4.22 - 5.81 MIL/uL   Hemoglobin 14.6 13.0 - 17.0 g/dL   HCT 63.8 45.3 - 64.6 %   MCV 98.4 80.0 - 100.0 fL   MCH 34.3 (H) 26.0 - 34.0 pg   MCHC 34.8 30.0 - 36.0 g/dL   RDW 80.3 21.2 - 24.8 %   Platelets 287 150 - 400 K/uL   nRBC 0.0 0.0 - 0.2 %   Neutrophils Relative % 56 %   Neutro Abs 4.9 1.7 - 7.7 K/uL   Lymphocytes Relative 28 %   Lymphs Abs 2.4 0.7 - 4.0 K/uL   Monocytes Relative 11 %   Monocytes Absolute 1.0 0.1 - 1.0 K/uL   Eosinophils Relative 3 %   Eosinophils Absolute 0.2 0.0 - 0.5 K/uL   Basophils Relative 1 %   Basophils Absolute 0.1 0.0 - 0.1 K/uL   Immature Granulocytes 1 %   Abs Immature Granulocytes 0.07 0.00 - 0.07 K/uL    Comment: Performed at [facility], [address] [place], [place] [address]    No results found.  Review of Systems  Constitutional: Negative for chills and fever.  Respiratory: Negative for shortness of breath.   Cardiovascular: Negative for chest pain.  Musculoskeletal: Positive for joint pain.  Skin:       Right elbow erythema and mild edema.   Blood pressure (!) 137/97, pulse 97,  temperature 99.2 F (37.3 C), temperature source Oral, resp. rate 18, height 6' (1.829 m), weight 124.2 kg, SpO2 96 %. Physical Exam  Constitutional: He is oriented to person, place, and time. He appears well-developed and well-nourished. No distress.  Respiratory: Effort normal.  Neurological: He is alert and oriented to person, place, and time.  Skin: He is not diaphoretic.  Right elbow mild edema and erythema.   Psychiatric: He has a normal mood and affect.   Right elbow : Decreased range of motion lacks approximately 20 -25 degrees of full extension . Good flexion. Edema right olecranon region without gross fluid accumulation.No drainage or ulceration. 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  Right hand well perfused with intact motor and sensation.   Assessment/Plan: Right elbow pain and swelling for 4 days no known injury. Radiographs show no acute fracture but do show findings consistent with olecranon bursitis. Patient afebrile and no leucocytosis. Uric acid 8.4.   Blood cultures negative. Patient does not appear septic.  Will obtain MRI right elbow to rule out abscess.  Recommend to continue Colchine for possible gout.  Further recommendations to follow once MRI performed.   Sean Ibarra 05/07/2018, 11:42 AM

## 2018-05-07 NOTE — Progress Notes (Signed)
PROGRESS NOTE    Sean Ibarra  ZOX:[id number] DOB: 1986/02/12 DOA: 05/01/2018 PCP: Patient, No Pcp Per    Brief Narrative:  33 year old male who presented with alcohol withdrawal symptoms, he does have significant past medical history for severe alcohol abuse.Apparently he tried to decrease his alcohol intake, triggering generalized body aches, nausea, chest pain and chills. On his initial physical examination temperature 98.7, blood pressure 179/125, heart rate 117, respiratory rate 25, oxygen saturation 97%.His mucous membranes are moist, lungs clear to auscultation bilaterally, heart S1-S2 present rhythmic, abdomen soft nontender, no lower extremity edema.  Patient was admitted to the hospital with working diagnosis of acute alcohol withdrawal complicated with hypertension   Assessment & Plan:   Principal Problem:   Delirium tremens (HCC) Active Problems:   Alcohol abuse   Tobacco abuse   Benign essential HTN   Sinus tachycardia   1. Acute right elbow arthritis. Patient with persistent pain on the right elbow, continue to have worsening edema and decreased range of motion. It seems to be not responding to anti-inflammatory therapy. Cultures have been no growth and patient has remained afebrile with no leukocytosis. Will hold on antibiotic therapy for now, but will consult orthopedics for possible diagnostic arthrocentesis. For now will continue with IV ketorolac, colchicine and as needed morphine IV.   1. Acute alcohol withdrawal.Continue multivitamins and thiamine. Will add as needed lorazepam po, currently no further signs of withdrawal. Continue neuro checks per unit protocol.   2. HTN. Stable blood pressure off medical therapy.   3. Tobacco abuse. Continue smoking cessation.   4. Elevated LFT. Clinically improving with no encephalopathy. Patient with no abdominal pain, no nausea or vomiting.   5. Obesity. Calculated BMI 37, will need outpatient follow up.   DVT  prophylaxis:enoxaparin Code Status:full Family Communication:no family at the bedside Disposition Plan/ discharge barriers:pending orthopedics consultation.  Body mass index is 37.14 kg/m. Malnutrition Type:      Malnutrition Characteristics:      Nutrition Interventions:     RN Pressure Injury Documentation:     Consultants:   Orthopedics  Procedures:     Antimicrobials:       Subjective: Patient continue to have right elbow pain, worse with movement, decrease range of motion, mild relieve with IV analgesics.   Objective: Vitals:   05/06/18 1636 05/06/18 2050 05/07/18 0451 05/07/18 0733  BP: (!) 128/94 (!) 135/102 (!) 159/97 (!) 137/97  Pulse: 90 92 96 97  Resp: 20 18 18 18   Temp: 98.4 F (36.9 C) 98.9 F (37.2 C) 98.3 F (36.8 C) 99.2 F (37.3 C)  TempSrc: Oral Oral Oral Oral  SpO2: 95% 94% 100% 96%  Weight:  124.2 kg    Height:        Intake/Output Summary (Last 24 hours) at 05/07/2018 0950 Last data filed at 05/07/2018 0905 Gross per 24 hour  Intake 1316 ml  Output 0 ml  Net 1316 ml   Filed Weights   05/02/18 1148 05/05/18 2020 05/06/18 2050  Weight: 127.4 kg 128 kg 124.2 kg    Examination:   General: in pain.  Neurology: Awake and alert, non focal, no tremors  E ENT: no pallor, no icterus, oral mucosa moist Cardiovascular: No JVD. S1-S2 present, rhythmic, no gallops, rubs, or murmurs. No lower extremity edema. Pulmonary: positive breath sounds bilaterally, adequate air movement, no wheezing, rhonchi or rales. Gastrointestinal. Abdomen with, no organomegaly, non tender, no rebound or guarding Skin. No rashes Musculoskeletal: right elbow continue to have edema and  erythema, tender to palpation and decreased range of motion. Worsening edema compared to yesterday.      Data Reviewed: I have personally reviewed following labs and imaging studies  CBC: Recent Labs  Lab 05/01/18 1919 05/02/18 0459 05/03/18 0559  05/06/18 0332 05/07/18 0548  WBC 6.3 7.3 6.4 9.3 8.7  NEUTROABS  --   --   --  5.6 4.9  HGB 16.2 14.4 13.7 14.4 14.6  HCT 44.9 39.2 37.0* 40.2 41.9  MCV 92.4 93.1 93.0 96.4 98.4  PLT 290 267 230 271 287   Basic Metabolic Panel: Recent Labs  Lab 05/01/18 1919 05/02/18 0459 05/03/18 0559 05/04/18 0540 05/05/18 0708 05/06/18 0332 05/07/18 0548  NA 135 136 137 139 136 137 138  K 3.5 3.5 3.2* 3.4* 3.6 3.8 4.1  CL 100 100 102 100 99 99 99  CO2 17* 24 24 26 24 26 26   GLUCOSE 141* 120* 108* 132* 130* 118* 101*  BUN <5* 5* <5* <5* 5* 9 11  CREATININE 0.77 0.81 0.88 0.83 0.75 0.77 0.73  CALCIUM 9.0 8.4* 8.7* 9.1 9.4 9.3 9.4  MG 1.5* 1.9  --  1.7  --   --   --   PHOS 3.4  --   --   --   --   --   --    GFR: Estimated Creatinine Clearance: 177 mL/min (by C-G formula based on SCr of 0.73 mg/dL). Liver Function Tests: Recent Labs  Lab 05/01/18 1919 05/02/18 0459 05/03/18 0559 05/04/18 0540 05/06/18 0332  AST 165* 157* 171* 276*  272* 91*  ALT 189* 159* 175* 251*  252* 186*  ALKPHOS 44 38 43 45  45 41  BILITOT 0.8 1.2 1.3* 1.2  1.1 1.4*  PROT 7.2 6.1* 6.2* 6.2*  6.4* 7.1  ALBUMIN 3.7 3.1* 3.0* 3.0*  3.0* 3.1*   No results for input(s): LIPASE, AMYLASE in the last 168 hours. No results for input(s): AMMONIA in the last 168 hours. Coagulation Profile: Recent Labs  Lab 05/03/18 0559  INR 1.10   Cardiac Enzymes: No results for input(s): CKTOTAL, CKMB, CKMBINDEX, TROPONINI in the last 168 hours. BNP (last 3 results) No results for input(s): PROBNP in the last 8760 hours. HbA1C: No results for input(s): HGBA1C in the last 72 hours. CBG: No results for input(s): GLUCAP in the last 168 hours. Lipid Profile: No results for input(s): CHOL, HDL, LDLCALC, TRIG, CHOLHDL, LDLDIRECT in the last 72 hours. Thyroid Function Tests: No results for input(s): TSH, T4TOTAL, FREET4, T3FREE, THYROIDAB in the last 72 hours. Anemia Panel: No results for input(s): VITAMINB12, FOLATE,  FERRITIN, TIBC, IRON, RETICCTPCT in the last 72 hours.    Radiology Studies: I have reviewed all of the imaging during this hospital visit personally     Scheduled Meds: . colchicine  0.6 mg Oral TID  . enoxaparin (LOVENOX) injection  40 mg Subcutaneous Q24H  . folic acid  1 mg Oral Daily  . ketorolac  30 mg Intravenous Q8H  . multivitamin with minerals  1 tablet Oral Daily  . nicotine  21 mg Transdermal Daily  . pantoprazole  40 mg Oral Daily  . thiamine  100 mg Oral Daily   Continuous Infusions:   LOS: 6 days        Mauricio Annett Gula, MD [facility] Pager [phone]

## 2018-05-08 ENCOUNTER — Other Ambulatory Visit (INDEPENDENT_AMBULATORY_CARE_PROVIDER_SITE_OTHER): Payer: Self-pay | Admitting: Physician Assistant

## 2018-05-08 LAB — CBC WITH DIFFERENTIAL/PLATELET
ABS IMMATURE GRANULOCYTES: 0.05 10*3/uL (ref 0.00–0.07)
Basophils Absolute: 0.1 10*3/uL (ref 0.0–0.1)
Basophils Relative: 1 %
Eosinophils Absolute: 0.1 10*3/uL (ref 0.0–0.5)
Eosinophils Relative: 2 %
HCT: 39.4 % (ref 39.0–52.0)
Hemoglobin: 13.8 g/dL (ref 13.0–17.0)
Immature Granulocytes: 1 %
Lymphocytes Relative: 26 %
Lymphs Abs: 2 10*3/uL (ref 0.7–4.0)
MCH: 34.2 pg — ABNORMAL HIGH (ref 26.0–34.0)
MCHC: 35 g/dL (ref 30.0–36.0)
MCV: 97.8 fL (ref 80.0–100.0)
MONOS PCT: 12 %
Monocytes Absolute: 0.9 10*3/uL (ref 0.1–1.0)
Neutro Abs: 4.6 10*3/uL (ref 1.7–7.7)
Neutrophils Relative %: 58 %
Platelets: 329 10*3/uL (ref 150–400)
RBC: 4.03 MIL/uL — ABNORMAL LOW (ref 4.22–5.81)
RDW: 12.2 % (ref 11.5–15.5)
WBC: 7.8 10*3/uL (ref 4.0–10.5)
nRBC: 0 % (ref 0.0–0.2)

## 2018-05-08 LAB — BASIC METABOLIC PANEL
Anion gap: 12 (ref 5–15)
BUN: 13 mg/dL (ref 6–20)
CO2: 26 mmol/L (ref 22–32)
Calcium: 9.2 mg/dL (ref 8.9–10.3)
Chloride: 100 mmol/L (ref 98–111)
Creatinine, Ser: 0.79 mg/dL (ref 0.61–1.24)
GFR calc Af Amer: >60 mL/min (ref >60)
GFR calc non Af Amer: >60 mL/min (ref >60)
Glucose, Bld: 142 mg/dL — ABNORMAL HIGH (ref 70–99)
Potassium: 4 mmol/L (ref 3.5–5.1)
Sodium: 138 mmol/L (ref 135–145)

## 2018-05-08 MED ORDER — ACETAMINOPHEN 325 MG PO TABS
650.0000 mg | ORAL_TABLET | Freq: Four times a day (QID) | ORAL | Status: DC | PRN
  Administered 2018-05-08 (×2): 650 mg via ORAL
  Filled 2018-05-08 (×2): qty 2

## 2018-05-08 MED ORDER — OXYCODONE-ACETAMINOPHEN 5-325 MG PO TABS
1.0000 | ORAL_TABLET | ORAL | Status: DC | PRN
  Administered 2018-05-08 – 2018-05-10 (×9): 1 via ORAL
  Filled 2018-05-08 (×9): qty 1

## 2018-05-08 MED ORDER — PREDNISONE 20 MG PO TABS
20.0000 mg | ORAL_TABLET | Freq: Once | ORAL | Status: AC
  Administered 2018-05-08: 20 mg via ORAL
  Filled 2018-05-08: qty 1

## 2018-05-08 MED ORDER — LORAZEPAM 1 MG PO TABS: 1.0000 mg | ORAL_TABLET | Freq: Four times a day (QID) | ORAL | Status: DC | PRN

## 2018-05-08 MED ORDER — MORPHINE SULFATE (PF) 4 MG/ML IV SOLN
4.0000 mg | Freq: Four times a day (QID) | INTRAVENOUS | Status: DC | PRN
  Administered 2018-05-08 – 2018-05-10 (×7): 4 mg via INTRAVENOUS
  Filled 2018-05-08 (×7): qty 1

## 2018-05-08 NOTE — Progress Notes (Signed)
PROGRESS NOTE    Sean Ibarra  GYI:[id number] DOB: 1985/10/03 DOA: 05/01/2018 PCP: Patient, No Pcp Per    Brief Narrative:  33 year old male who presented with alcohol withdrawal symptoms, he does have significant past medical history for severe alcohol abuse.Apparently he tried to decrease his alcohol intake, triggering generalized body aches, nausea, chest pain and chills. On his initial physical examination temperature 98.7, blood pressure 179/125, heart rate 117, respiratory rate 25, oxygen saturation 97%.His mucous membranes are moist, lungs clear to auscultation bilaterally, heart S1-S2 present rhythmic, abdomen soft nontender, no lower extremity edema.  Patient was admitted to the hospital with working diagnosis of acute alcohol withdrawal complicated with hypertension   Assessment & Plan:   Principal Problem:   Delirium tremens (HCC) Active Problems:   Alcohol abuse   Tobacco abuse   Benign essential HTN   Sinus tachycardia   1. Acute right elbow arthritis. Pain and range of motion have improved but not yet back to baseline, no clinical signs of infection. Patient started on oral prednisone with good response, will continue to follow on edema, pain and range of motion. Continue colchicine tid, will dc ketorolac. Continue pain control with IV morphine and will add oral oxycodone. Continue gi prophylaxis with pantoprazole.   1. Acute alcohol withdrawal.He has been stable with no major tremors, agitation or hallucinations, continue with multivitamins and thiamine. As needed lorazepam.  2. HTN. Blood pressure has remained stable.    3. Tobacco abuse. Smoking cessation.  4. Elevated LFT. Will check lft in am, patient has been tolerating po well, no nausea or vomiting, no abdominal pain.   5. Obesity. Calculated BMI 37.    DVT prophylaxis: enoxaparin   Code Status:  full Family Communication: no family at the bedside  Disposition Plan/ discharge barriers:  pending clinical improvement, possible dc in am.   Body mass index is 37.14 kg/m. Malnutrition Type:      Malnutrition Characteristics:      Nutrition Interventions:     RN Pressure Injury Documentation:     Consultants:     Procedures:     Antimicrobials:       Subjective: Patient continue to have right elbow pain with edema, continue to have decreased range of motion, some improvement but not back to baseline.   Objective: Vitals:   05/07/18 0733 05/07/18 1759 05/07/18 2135 05/08/18 0738  BP: (!) 137/97 136/85 123/69 127/82  Pulse: 97 87 82 76  Resp: 18 20  19   Temp: 99.2 F (37.3 C) 99 F (37.2 C) 98.4 F (36.9 C) 98.9 F (37.2 C)  TempSrc: Oral Oral Oral Oral  SpO2: 96% 95% 94% 96%  Weight:      Height:        Intake/Output Summary (Last 24 hours) at 05/08/2018 0920 Last data filed at 05/08/2018 0600 Gross per 24 hour  Intake 1020 ml  Output 0 ml  Net 1020 ml   Filed Weights   05/02/18 1148 05/05/18 2020 05/06/18 2050  Weight: 127.4 kg 128 kg 124.2 kg    Examination:   General: not in dyspnea.  Neurology: Awake and alert, non focal  E ENT: mild pallor, no icterus, oral mucosa moist Cardiovascular: No JVD. S1-S2 present, rhythmic, no gallops, rubs, or murmurs. No lower extremity edema. Pulmonary: positive breath sounds bilaterally, adequate air movement, no wheezing, rhonchi or rales. Gastrointestinal. Abdomen with no organomegaly, non tender, no rebound or guarding Skin. No rashes Musculoskeletal: right elbow with edema and local tenderness, erythema has improved  along with range of motion, but continue to be limited.      Data Reviewed: I have personally reviewed following labs and imaging studies  CBC: Recent Labs  Lab 05/02/18 0459 05/03/18 0559 05/06/18 0332 05/07/18 0548 05/08/18 0510  WBC 7.3 6.4 9.3 8.7 7.8  NEUTROABS  --   --  5.6 4.9 4.6  HGB 14.4 13.7 14.4 14.6 13.8  HCT 39.2 37.0* 40.2 41.9 39.4  MCV 93.1 93.0  96.4 98.4 97.8  PLT 267 230 271 287 329   Basic Metabolic Panel: Recent Labs  Lab 05/01/18 1919 05/02/18 0459  05/04/18 0540 05/05/18 0708 05/06/18 0332 05/07/18 0548 05/08/18 0510  NA 135 136   < > 139 136 137 138 138  K 3.5 3.5   < > 3.4* 3.6 3.8 4.1 4.0  CL 100 100   < > 100 99 99 99 100  CO2 17* 24   < > 26 24 26 26 26   GLUCOSE 141* 120*   < > 132* 130* 118* 101* 142*  BUN <5* 5*   < > <5* 5* 9 11 13   CREATININE 0.77 0.81   < > 0.83 0.75 0.77 0.73 0.79  CALCIUM 9.0 8.4*   < > 9.1 9.4 9.3 9.4 9.2  MG 1.5* 1.9  --  1.7  --   --   --   --   PHOS 3.4  --   --   --   --   --   --   --    < > = values in this interval not displayed.   GFR: Estimated Creatinine Clearance: 177 mL/min (by C-G formula based on SCr of 0.79 mg/dL). Liver Function Tests: Recent Labs  Lab 05/01/18 1919 05/02/18 0459 05/03/18 0559 05/04/18 0540 05/06/18 0332  AST 165* 157* 171* 276*  272* 91*  ALT 189* 159* 175* 251*  252* 186*  ALKPHOS 44 38 43 45  45 41  BILITOT 0.8 1.2 1.3* 1.2  1.1 1.4*  PROT 7.2 6.1* 6.2* 6.2*  6.4* 7.1  ALBUMIN 3.7 3.1* 3.0* 3.0*  3.0* 3.1*   No results for input(s): LIPASE, AMYLASE in the last 168 hours. No results for input(s): AMMONIA in the last 168 hours. Coagulation Profile: Recent Labs  Lab 05/03/18 0559  INR 1.10   Cardiac Enzymes: No results for input(s): CKTOTAL, CKMB, CKMBINDEX, TROPONINI in the last 168 hours. BNP (last 3 results) No results for input(s): PROBNP in the last 8760 hours. HbA1C: No results for input(s): HGBA1C in the last 72 hours. CBG: No results for input(s): GLUCAP in the last 168 hours. Lipid Profile: No results for input(s): CHOL, HDL, LDLCALC, TRIG, CHOLHDL, LDLDIRECT in the last 72 hours. Thyroid Function Tests: No results for input(s): TSH, T4TOTAL, FREET4, T3FREE, THYROIDAB in the last 72 hours. Anemia Panel: No results for input(s): VITAMINB12, FOLATE, FERRITIN, TIBC, IRON, RETICCTPCT in the last 72  hours.    Radiology Studies: I have reviewed all of the imaging during this hospital visit personally     Scheduled Meds: . colchicine  0.6 mg Oral TID  . enoxaparin (LOVENOX) injection  40 mg Subcutaneous Q24H  . folic acid  1 mg Oral Daily  . ketorolac  30 mg Intravenous Q8H  . multivitamin with minerals  1 tablet Oral Daily  . nicotine  21 mg Transdermal Daily  . pantoprazole  40 mg Oral Daily  . thiamine  100 mg Oral Daily   Continuous Infusions:   LOS: 7 days  Nancyjo Givhan Gerome Apley, MD [facility] Pager [phone]

## 2018-05-08 NOTE — Progress Notes (Signed)
Patient ID: Sean Ibarra, male   DOB: 01-02-1986, 33 y.o.   MRN: [id number] His right elbow exam has improved some today after getting the steroid yesterday, which points to this being an inflammatory process.  Will get another dose of prednisone today.  Vitals and WBC normal.

## 2018-05-09 LAB — HEPATIC FUNCTION PANEL
ALT: 323 U/L — ABNORMAL HIGH (ref 0–44)
AST: 200 U/L — ABNORMAL HIGH (ref 15–41)
Albumin: 3.2 g/dL — ABNORMAL LOW (ref 3.5–5.0)
Alkaline Phosphatase: 42 U/L (ref 38–126)
BILIRUBIN DIRECT: 0.2 mg/dL (ref 0.0–0.2)
Indirect Bilirubin: 0.5 mg/dL (ref 0.3–0.9)
Total Bilirubin: 0.7 mg/dL (ref 0.3–1.2)
Total Protein: 7.4 g/dL (ref 6.5–8.1)

## 2018-05-09 MED ORDER — PREDNISONE 20 MG PO TABS
40.0000 mg | ORAL_TABLET | Freq: Every day | ORAL | Status: DC
  Administered 2018-05-09 – 2018-05-10 (×2): 40 mg via ORAL
  Filled 2018-05-09 (×2): qty 2

## 2018-05-09 NOTE — Discharge Instructions (Signed)
Alcohol Use Disorder  Alcohol use disorder is when your drinking disrupts your daily life. When you have this condition, you drink too much alcohol and you cannot control your drinking.  Alcohol use disorder can cause serious problems with your physical health. It can affect your brain, heart, liver, pancreas, immune system, stomach, and intestines. Alcohol use disorder can increase your risk for certain cancers and cause problems with your mental health, such as depression, anxiety, psychosis, delirium, and dementia. People with this disorder risk hurting themselves and others.  What are the causes?  This condition is caused by drinking too much alcohol over time. It is not caused by drinking too much alcohol only one or two times. Some people with this condition drink alcohol to cope with or escape from negative life events. Others drink to relieve pain or symptoms of mental illness.  What increases the risk?  You are more likely to develop this condition if:   You have a family history of alcohol use disorder.   Your culture encourages drinking to the point of intoxication, or makes alcohol easy to get.   You had a mood or conduct disorder in childhood.   You have been a victim of abuse.   You are an adolescent and:  ? You have poor grades or difficulties in school.  ? Your caregivers do not talk to you about saying no to alcohol, or supervise your activities.  ? You are impulsive or you have trouble with self-control.  What are the signs or symptoms?  Symptoms of this condition include:   Drinkingmore than you want to.   Drinking for longer than you want to.   Trying several times to drink less or to control your drinking.   Spending a lot of time getting alcohol, drinking, or recovering from drinking.   Craving alcohol.   Having problems at work, at school, or at home due to drinking.   Having problems in relationships due to drinking.   Drinking when it is dangerous to drink, such as before  driving a car.   Continuing to drink even though you know you might have a physical or mental problem related to drinking.   Needing more and more alcohol to get the same effect you want from the alcohol (building up tolerance).   Having symptoms of withdrawal when you stop drinking. Symptoms of withdrawal include:  ? Fatigue.  ? Nightmares.  ? Trouble sleeping.  ? Depression.  ? Anxiety.  ? Fever.  ? Seizures.  ? Severe confusion.  ? Feeling or seeing things that are not there (hallucinations).  ? Tremors.  ? Rapid heart rate.  ? Rapid breathing.  ? High blood pressure.   Drinking to avoid symptoms of withdrawal.  How is this diagnosed?  This condition is diagnosed with an assessment. Your health care provider may start the assessment by asking three or four questions about your drinking.  Your health care provider may perform a physical exam or do lab tests to see if you have physical problems resulting from alcohol use. She or he may refer you to a mental health professional for evaluation.  How is this treated?  Some people with alcohol use disorder are able to reduce their alcohol use to low-risk levels. Others need to completely quit drinking alcohol. When necessary, mental health professionals with specialized training in substance use treatment can help. Your health care provider can help you decide how severe your alcohol use disorder is and what type   of treatment you need. The following forms of treatment are available:   Detoxification. Detoxification involves quitting drinking and using prescription medicines within the first week to help lessen withdrawal symptoms. This treatment is important for people who have had withdrawal symptoms before and for heavy drinkers who are likely to have withdrawal symptoms. Alcohol withdrawal can be dangerous, and in severe cases, it can cause death. Detoxification may be provided in a home, community, or primary care setting, or in a hospital or substance use  treatment facility.   Counseling. This treatment is also called talk therapy. It is provided by substance use treatment counselors. A counselor can address the reasons you use alcohol and suggest ways to keep you from drinking again or to prevent problem drinking. The goals of talk therapy are to:  ? Find healthy activities and ways for you to cope with stress.  ? Identify and avoid the things that trigger your alcohol use.  ? Help you learn how to handle cravings.   Medicines.Medicines can help treat alcohol use disorder by:  ? Decreasing alcohol cravings.  ? Decreasing the positive feeling you have when you drink alcohol.  ? Causing an uncomfortable physical reaction when you drink alcohol (aversion therapy).   Support groups. Support groups are led by people who have quit drinking. They provide emotional support, advice, and guidance.  These forms of treatment are often combined. Some people with this condition benefit from a combination of treatments provided by specialized substance use treatment centers.  Follow these instructions at home:   Take over-the-counter and prescription medicines only as told by your health care provider.   Check with your health care provider before starting any new medicines.   Ask friends and family members not to offer you alcohol.   Avoid situations where alcohol is served, including gatherings where others are drinking alcohol.   Create a plan for what to do when you are tempted to use alcohol.   Find hobbies or activities that you enjoy that do not include alcohol.   Keep all follow-up visits as told by your health care provider. This is important.  How is this prevented?   If you drink, limit alcohol intake to no more than 1 drink a day for nonpregnant women and 2 drinks a day for men. One drink equals 12 oz of beer, 5 oz of wine, or 1 oz of hard liquor.   If you have a mental health condition, get treatment and support.   Do not give alcohol to  adolescents.   If you are an adolescent:  ? Do not drink alcohol.  ? Do not be afraid to say no if someone offers you alcohol. Speak up about why you do not want to drink. You can be a positive role model for your friends and set a good example for those around you by not drinking alcohol.  ? If your friends drink, spend time with others who do not drink alcohol. Make new friends who do not use alcohol.  ? Find healthy ways to manage stress and emotions, such as meditation or deep breathing, exercise, spending time in nature, listening to music, or talking with a trusted friend or family member.  Contact a health care provider if:   You are not able to take your medicines as told.   Your symptoms get worse.   You return to drinking alcohol (relapse) and your symptoms get worse.  Get help right away if:   You have thoughts   about hurting yourself or others.  If you ever feel like you may hurt yourself or others, or have thoughts about taking your own life, get help right away. You can go to your nearest emergency department or call:   Your local emergency services (911 in the [place]   A suicide crisis helpline, such as the National Suicide Prevention Lifeline at 1-800-273-8255. This is open 24 hours a day.  Summary   Alcohol use disorder is when your drinking disrupts your daily life. When you have this condition, you drink too much alcohol and you cannot control your drinking.   Treatment may include detoxification, counseling, medicine, and support groups.   Ask friends and family members not to offer you alcohol. Avoid situations where alcohol is served.   Get help right away if you have thoughts about hurting yourself or others.  This information is not intended to replace advice given to you by your health care provider. Make sure you discuss any questions you have with your health care provider.  Document Released: 05/14/2004 Document Revised: 01/02/2016 Document Reviewed: 01/02/2016  Elsevier  Interactive Patient Education  2019 [employer]  Alcohol Withdrawal Syndrome  Alcohol withdrawal syndrome is a group of symptoms that can develop when a person who drinks heavily and regularly stops drinking or drinks less. Alcohol withdrawal syndrome can be mild or severe, and it may even be life-threatening.  Alcohol withdrawal syndrome usually affects people who have alcohol use disorder, which may also be called alcoholism. Alcohol use disorder is when a person is unable to control his or her alcohol use, and drinking too much or too often causes problems at home, at work, or in relationships.  What are the causes?  Drinking heavily and drinking on a regular basis cause changes in brain chemistry. Over time, the body becomes dependent on alcohol. When alcohol use stops, the chemistry system in the brain becomes unbalanced and causes the symptoms of alcohol withdrawal.  What increases the risk?  Alcohol withdrawal syndrome is more likely to occur in people who drink more than the recommended limit of alcohol (2 drinks a day for men or 1 drink a day for non-pregnant women). It is also more likely to affect heavy drinkers who have been using alcohol for long periods of time. The more a person drinks and the longer he or she drinks, the greater the risk of alcohol withdrawal syndrome.  Severe withdrawal is more likely to develop in someone who:   Had severe alcohol withdrawal in the past.   Had a seizure during a previous episode of alcohol withdrawal.   Is elderly.   Uses other drugs.   Has a long-term (chronic) medical problem, such as heart, lung, or liver disease.   Has depression.   Does not get enough nutrients from his or her diet (malnutrition).  What are the signs or symptoms?  Symptoms of this condition can be mild to moderate, or they can be severe. Symptoms may develop a few hours (or up to a day) after a person changes his or her drinking patterns. During the 48 hours after he or she has  stopped drinking, the following symptoms may go away or get better:   Uncontrollable shaking (tremor).   Sweating.   Headache.   Anxiety.   Inability to relax (agitation).   Trouble sleeping (insomnia).   Irregular heartbeats (palpitations).   Alcohol cravings.   Seizure.  The following symptoms may get worse 24-48 hours after a person has decreased   or stopped alcohol use, and they may gradually improve over a period of days or weeks:   Nausea and vomiting.   Fatigue.   Sensitivity to light and sounds.   Confusion and inability to think clearly.   Loss of appetite.   Mood swings, irritability, depression, and anxiety.   Insomnia and nightmares.  The following symptoms are severe and life-threatening. When these symptoms occur together, they are called delirium tremens (DTs):   High blood pressure.   Increased heart rate.   Trouble breathing.   Seizures. These may go away along with other symptoms, or they may persist.   Seeing, hearing, feeling, smelling, or tasting things that are not there (hallucinations). If you experience hallucinations, they usually begin 12-24 hours after a change in drinking patterns.  Delirium tremens requires immediate hospitalization.  How is this diagnosed?  This condition may be diagnosed based on:   Your symptoms and medical history.   Your history of alcohol use. Your health care provider may ask questions about your drinking behavior. It is important to be honest when you answer these questions.   A psychological assessment.   A physical exam.   Blood tests or urine tests to measure blood alcohol level and to rule out other causes of symptoms.   MRI or CT scan. This may be done if you seem to have abnormal thinking or behaviors (altered mental status).  Diagnosis can be difficult. People going through withdrawal often avoid seeking medical care and are not thinking clearly. Friends and family members play an important role in recognizing symptoms and  encouraging loved ones to get treatment.  How is this treated?  Most people with symptoms of withdrawal can be treated outside of a hospital setting (outpatient treatment), with close monitoring such as daily check-ins with a health care provider and counseling. You may need treatment at a hospital or treatment center (inpatient treatment) if:   You have a history of delirium tremens or seizures.   You have severe symptoms.   You are addicted to other drugs.   You cannot swallow medicine.   You have a serious medical condition such as heart failure.   You experienced withdrawal in the past but then you continued drinking alcohol.   You are not likely to commit to an outpatient treatment schedule.  Treatment may involve:   Monitoring your blood pressure, pulse, and breathing.   IV fluids to keep you hydrated.   Medicines to reduce withdrawal symptoms and discomfort (benzodiazepines).   Medicine to reduce anxiety.   Medicine to prevent or control seizures.   Multivitamins and B vitamins.   Having a health care provider check on you daily.  It is important to get treatment for alcohol withdrawal early. Getting treatment early can:   Speed up your recovery from withdrawal symptoms.   Make you more successful with long-term stoppage of alcohol use (sobriety).  If you need help to stop drinking, your health care provider may recommend a long-term treatment plan that includes:   Medicines to help treat alcohol use disorder.   Substance abuse counseling.   Support groups.  Follow these instructions at home:     Take over-the-counter and prescription medicines (including vitamin supplements) only as told by your health care provider.   Do not drink alcohol.   Do not drive until your health care provider approves.   Have someone you trust stay with you or be available if you need help with your symptoms or with not   drinking.   Drink enough fluid to keep your urine pale yellow.   Consider joining an  alcohol support group or treatment program. These can provide emotional support, advice, and guidance.   Keep all follow-up visits as told by your health care provider. This is important.  Contact a health care provider if:   Your symptoms get worse instead of better.   You cannot eat or drink without vomiting.   You are struggling with not drinking alcohol.   You cannot stop drinking alcohol.  Get help right away if:   You have an irregular heartbeat.   You have chest pain.   You have trouble breathing.   You have a seizure for the first time.   You hallucinate.   You become very confused.  Summary   Alcohol withdrawal is a group of symptoms that can develop when a person who drinks heavily and regularly stops drinking or drinks less.   Symptoms of this condition can be mild to moderate, or they can be severe.   Treatment may include hospitalization, medicine, and counseling.  This information is not intended to replace advice given to you by your health care provider. Make sure you discuss any questions you have with your health care provider.  Document Released: 01/14/2005 Document Revised: 12/11/2016 Document Reviewed: 12/11/2016  [employer]vier Interactive Patient Education  2019 [employer]

## 2018-05-09 NOTE — Progress Notes (Signed)
PROGRESS NOTE    Sean Ibarra  IHK:[id number] DOB: Dec 29, 1985 DOA: 05/01/2018 PCP: Patient, No Pcp Per    Brief Narrative:  33 year old male who presented with alcohol withdrawal symptoms, he does have significant past medical history for severe alcohol abuse.Apparently he tried to decrease his alcohol intake, triggering generalized body aches, nausea, chest pain and chills. On his initial physical examination temperature 98.7, blood pressure 179/125, heart rate 117, respiratory rate 25, oxygen saturation 97%.His mucous membranes are moist, lungs clear to auscultation bilaterally, heart S1-S2 present rhythmic, abdomen soft nontender, no lower extremity edema.  Patient was admitted to the hospital with working diagnosis of acute alcohol withdrawal complicated with hypertension   Assessment & Plan:   Principal Problem:   Delirium tremens (HCC) Active Problems:   Alcohol abuse   Tobacco abuse   Benign essential HTN   Sinus tachycardia   1. Acute right elbow arthritis.Pain and edema have improved but not back to baseline, continue to have significant pain and limited range of motion, will continue with daily prednisone 40 mg and tid colchicine. For breakthrough pain continue oral percocet and IV morphine for severe pain. Continue to follow up on Dr. Magnus IvanBlackman (Orthopedics) recommendations.   1. Acute alcohol withdrawal.Continue as needed lorazepam, no clinical signs of withdrawal, continue as needed lorazepam for now.  2. HTN. Continue blood pressure monitoring, currently off antihypertensive agents.    3. Tobacco abuse.continue smoking cessation.  4. Elevated LFT. Persistent elevation LFT in the setting of acute alcohol intoxication, will continue to follow lft in am, avoid hepatotoxic medications and further alcohol consumption.   5. Obesity. Calculated BMI 37. will need outpatient follow up.    DVT prophylaxis: enoxaparin   Code Status:  full Family  Communication: no family at the bedside  Disposition Plan/ discharge barriers: pending clinical improvement, possible dc in am.   Body mass index is 37.01 kg/m. Malnutrition Type:      Malnutrition Characteristics:      Nutrition Interventions:     RN Pressure Injury Documentation:     Consultants:     Procedures:     Antimicrobials:       Subjective: Patient continue to have right elbow pain with local edema and decrease range of motion, pain is persistent with episodic worsening with movement and touch. No nausea or vomiting.   Objective: Vitals:   05/08/18 1628 05/08/18 1946 05/08/18 2129 05/09/18 0843  BP: (!) 143/93 111/88  (!) 133/91  Pulse: 86 79  81  Resp: 19 16  20   Temp: 98.3 F (36.8 C) 98.6 F (37 C)  98.4 F (36.9 C)  TempSrc: Oral Oral  Oral  SpO2: 96% 96%  97%  Weight:   123.8 kg   Height:        Intake/Output Summary (Last 24 hours) at 05/09/2018 1002 Last data filed at 05/09/2018 0900 Gross per 24 hour  Intake 840 ml  Output 0 ml  Net 840 ml   Filed Weights   05/06/18 2050 05/08/18 1300 05/08/18 2129  Weight: 124.2 kg 124.4 kg 123.8 kg    Examination:   General: deconditioned  Neurology: Awake and alert, non focal  E ENT: mild pallor, no icterus, oral mucosa moist Cardiovascular: No JVD. S1-S2 present, rhythmic, no gallops, rubs, or murmurs. No lower extremity edema. Pulmonary: positive breath sounds bilaterally, adequate air movement, no wheezing, rhonchi or rales. Gastrointestinal. Abdomen with no organomegaly, non tender, no rebound or guarding Skin. No rashes Musculoskeletal: right elbow continue to have edema  and decrease range of motion, mobility seems to be improved, along with tenderness, erythema and increased local temperature have improved.      Data Reviewed: I have personally reviewed following labs and imaging studies  CBC: Recent Labs  Lab 05/03/18 0559 05/06/18 0332 05/07/18 0548 05/08/18 0510    WBC 6.4 9.3 8.7 7.8  NEUTROABS  --  5.6 4.9 4.6  HGB 13.7 14.4 14.6 13.8  HCT 37.0* 40.2 41.9 39.4  MCV 93.0 96.4 98.4 97.8  PLT 230 271 287 329   Basic Metabolic Panel: Recent Labs  Lab 05/04/18 0540 05/05/18 0708 05/06/18 0332 05/07/18 0548 05/08/18 0510  NA 139 136 137 138 138  K 3.4* 3.6 3.8 4.1 4.0  CL 100 99 99 99 100  CO2 26 24 26 26 26   GLUCOSE 132* 130* 118* 101* 142*  BUN <5* 5* 9 11 13   CREATININE 0.83 0.75 0.77 0.73 0.79  CALCIUM 9.1 9.4 9.3 9.4 9.2  MG 1.7  --   --   --   --    GFR: Estimated Creatinine Clearance: 176.9 mL/min (by C-G formula based on SCr of 0.79 mg/dL). Liver Function Tests: Recent Labs  Lab 05/03/18 0559 05/04/18 0540 05/06/18 0332 05/09/18 0341  AST 171* 276*  272* 91* 200*  ALT 175* 251*  252* 186* 323*  ALKPHOS 43 45  45 41 42  BILITOT 1.3* 1.2  1.1 1.4* 0.7  PROT 6.2* 6.2*  6.4* 7.1 7.4  ALBUMIN 3.0* 3.0*  3.0* 3.1* 3.2*   No results for input(s): LIPASE, AMYLASE in the last 168 hours. No results for input(s): AMMONIA in the last 168 hours. Coagulation Profile: Recent Labs  Lab 05/03/18 0559  INR 1.10   Cardiac Enzymes: No results for input(s): CKTOTAL, CKMB, CKMBINDEX, TROPONINI in the last 168 hours. BNP (last 3 results) No results for input(s): PROBNP in the last 8760 hours. HbA1C: No results for input(s): HGBA1C in the last 72 hours. CBG: No results for input(s): GLUCAP in the last 168 hours. Lipid Profile: No results for input(s): CHOL, HDL, LDLCALC, TRIG, CHOLHDL, LDLDIRECT in the last 72 hours. Thyroid Function Tests: No results for input(s): TSH, T4TOTAL, FREET4, T3FREE, THYROIDAB in the last 72 hours. Anemia Panel: No results for input(s): VITAMINB12, FOLATE, FERRITIN, TIBC, IRON, RETICCTPCT in the last 72 hours.    Radiology Studies: I have reviewed all of the imaging during this hospital visit personally     Scheduled Meds: . colchicine  0.6 mg Oral TID  . enoxaparin (LOVENOX) injection   40 mg Subcutaneous Q24H  . folic acid  1 mg Oral Daily  . multivitamin with minerals  1 tablet Oral Daily  . nicotine  21 mg Transdermal Daily  . pantoprazole  40 mg Oral Daily  . thiamine  100 mg Oral Daily   Continuous Infusions:   LOS: 8 days        Mauricio Annett Gulaaniel Arrien, MD [facility] Pager [phone]

## 2018-05-09 NOTE — Care Management Note (Signed)
Case Management Note Hortencia Conradi, RN MSN CCM Transitions of Care 41M [place] [phone]  Patient Details  Name: Daran Lawhead MRN: [id number] Date of Birth: Apr 23, 1985  Subjective/Objective:       Delirium tremens           Action/Plan: PTA home. Independent. Discussed address in [place], [place]. States that he stays in [place] and that he has insurance. Discussed that Rosann Auerbach can assist with finding an inn PCP. Patient stated that his plans are to check himself into a detox facility. CM advised that [employer] could assist him with this as well. Will continue to follow for transition of care needs.   Expected Discharge Date:                  Expected Discharge Plan:  Home/Self Care  In-House Referral:  Financial Counselor  Discharge planning Services  CM Consult  Post Acute Care Choice:  NA Choice offered to:  NA  DME Arranged:  N/A DME Agency:  NA  HH Arranged:  NA HH Agency:  NA  Status of Service:  In process, will continue to follow  If discussed at Long Length of Stay Meetings, dates discussed:    Additional Comments: 05/09/2018-Received request from Bedside RN that [facility] was requesting information be faxed to [phone] (facesheet, H&P, Labs, MAR, and progress notes). Information faxed with success.   05/06/2018-Received call [phone] from Lupita Leash at [facility]. Lupita Leash has made referral for a [facility] CM to reach to patient and guide him through process of following up with inpatient treatment center and to assist with finding inn PCP. Provided number [phone] to confirm inn status for [facility]. Spoke with patient at bedside to advise of pending call and of inn status for [facility]. Bedside RN assisted patient with signing consent for [facility] to request records. Will continue to follow for transition of care needs.   Bess Kinds, RN 05/09/2018, 4:29 PM

## 2018-05-09 NOTE — Progress Notes (Signed)
Patient ID: Sean Ibarra, male   DOB: [birth year], 33 y.o.   MRN: [id number] Right elbow pain has not improved over past 24 hours, but also has not gotten worse.  I can actually flex and extend the elbow better.  There is still no redness and no evidence of infection.  Definitely an inflammatory process.  I may even place a steroid injection in his elbow joint this evening or tomorrow.

## 2018-05-10 LAB — HEPATIC FUNCTION PANEL
ALT: 470 U/L — ABNORMAL HIGH (ref 0–44)
AST: 172 U/L — ABNORMAL HIGH (ref 15–41)
Albumin: 3.5 g/dL (ref 3.5–5.0)
Alkaline Phosphatase: 45 U/L (ref 38–126)
BILIRUBIN INDIRECT: 0.3 mg/dL (ref 0.3–0.9)
Bilirubin, Direct: 0.2 mg/dL (ref 0.0–0.2)
Total Bilirubin: 0.5 mg/dL (ref 0.3–1.2)
Total Protein: 7.6 g/dL (ref 6.5–8.1)

## 2018-05-10 LAB — CULTURE, BLOOD (ROUTINE X 2)
Culture: NO GROWTH
Culture: NO GROWTH
Special Requests: ADEQUATE
Special Requests: ADEQUATE

## 2018-05-10 LAB — BASIC METABOLIC PANEL
Anion gap: 11 (ref 5–15)
BUN: 13 mg/dL (ref 6–20)
CHLORIDE: 99 mmol/L (ref 98–111)
CO2: 25 mmol/L (ref 22–32)
Calcium: 9.7 mg/dL (ref 8.9–10.3)
Creatinine, Ser: 0.9 mg/dL (ref 0.61–1.24)
GFR calc Af Amer: >60 mL/min (ref >60)
GFR calc non Af Amer: >60 mL/min (ref >60)
Glucose, Bld: 163 mg/dL — ABNORMAL HIGH (ref 70–99)
POTASSIUM: 4.2 mmol/L (ref 3.5–5.1)
Sodium: 135 mmol/L (ref 135–145)

## 2018-05-10 MED ORDER — OXYCODONE HCL 5 MG PO TABS
5.0000 mg | ORAL_TABLET | Freq: Four times a day (QID) | ORAL | Status: DC | PRN
  Administered 2018-05-10: 5 mg via ORAL
  Filled 2018-05-10: qty 1

## 2018-05-10 MED ORDER — PREDNISONE 20 MG PO TABS: 20.0000 mg | ORAL_TABLET | Freq: Every day | ORAL | 0 refills | Status: AC

## 2018-05-10 MED ORDER — OXYCODONE HCL 5 MG PO TABS: 5.0000 mg | ORAL_TABLET | Freq: Four times a day (QID) | ORAL | 0 refills | Status: DC | PRN

## 2018-05-10 NOTE — Progress Notes (Signed)
Patient ID: Sean Ibarra, male   DOB: Sep 16, 1985, 33 y.o.   MRN: [id number] I did place a steroid injection into his right elbow joint last evening.  He is much better overall today and his elbow is moving much better.  There is a significant decrease in pain.  Should continue to do well.  Call with questions/concerns.  Follow-up as needed.

## 2018-05-10 NOTE — Discharge Summary (Addendum)
Physician Discharge Summary  Sean Ibarra VWP:[id number] DOB: 1985-08-21 DOA: 05/01/2018  PCP: Patient, No Pcp Per  Admit date: 05/01/2018 Discharge date: 05/10/2018  Admitted From: Home  Disposition:  Home  Recommendations for Outpatient Follow-up and new medication changes:  1. Follow up with [facility]. in 7 days.  2. Please follow on liver function test in 7 days.  3. Continue prednisone for 5 days.  4. Avoid acetaminophen and non steroidal anti-inflammatory agents due to abnormal LFT, continue pain control with oxycodone for now.   Home Health: no   Equipment/Devices: no    Discharge Condition: stable  CODE STATUS: full  Diet recommendation: regular.   Brief/Interim Summary: 33 year old male who presented with alcohol withdrawal symptoms, he does have significant past medical history for severe alcohol abuse.Apparently he tried to decrease his alcohol intake, triggering generalized body aches, nausea, chest pain and chills. On his initial physical examination temperature 98.7, blood pressure 179/125, heart rate 117, respiratory rate 25, oxygen saturation 97%.His mucous membranes are moist, lungs were clear to auscultation bilaterally, heart S1-S2 present rhythmic, abdomen soft nontender, no lower extremity edema. Sodium 135, potassium 3.5, chloride 100, bicarb 17, glucose 141, BUN less than 5, creatinine 1.77, magnesium 1.5, AST 165, ALT 189, white count 6.3, hemoglobin 16.2, hematocrit 44.9, platelets 290, his alcohol level was 32 mg/dl, his EKG had normal sinus tachycardia, normal axis normal intervals.  Patient was admitted to the hospital with working diagnosis of acute alcohol withdrawal complicated with hypertension.  1.  Acute alcohol intoxication, complicated by alcohol withdrawal syndrome/complicated by hypertension..  Patient was admitted to the medical ward, he was placed on alcohol withdrawal protocol with as needed benzodiazepine per CIWA protocol with good  toleration.  By the time of his discharge she denied any hallucinations, agitation or confusion, he did not have any tremors or diaphoresis.  Once his withdrawal symptoms resolved his blood pressure improved as well.  2.  Acute right elbow arthritis, gout.  Patient had worsening right elbow pain, associated with erythema, edema, and decreased range of motion.  He was placed on nonsteroidal anti-inflammatory agents and colchicine , his uric acid in the serum returned to be elevated up to 8.4.  No signs of infection, his cultures remain no growth, patient was placed on systemic steroids and he did receive a local steroid injection with improvement of his symptoms.  At discharge he continued to have edema, pain and decreased motion but slowly improving.  He will continue taking prednisone for the next 5 days.  Pain control with oxycodone.  3.  Transaminasemia.  Patient had persistent elevation of AST, ALT, with improvement of hyperbilirubinemia.  At discharge his AST is 172, ALT 470, total bilirubin is 0.5.  Patient does not have any abdominal pain, nausea or vomiting.  He was informed about this abnormal liver function test, recommend follow-up within 7 days with primary care provider.  4.  Tobacco abuse.  Smoking cessation counseling.  5.  Obesity.  Clearly BMI 37, will need close follow-up as an outpatient.   Discharge Diagnoses:  Principal Problem:   Delirium tremens (HCC) Active Problems:   Alcohol abuse   Tobacco abuse   Benign essential HTN   Sinus tachycardia    Discharge Instructions   Allergies as of 05/10/2018   No Known Allergies     Medication List    TAKE these medications   oxyCODONE 5 MG immediate release tablet Commonly known as:  Oxy IR/ROXICODONE Take 1 tablet (5 mg total) by  mouth every 6 (six) hours as needed for severe pain.   predniSONE 20 MG tablet Commonly known as:  DELTASONE Take 1 tablet (20 mg total) by mouth daily with breakfast for 5 doses. Start  taking on:  May 11, 2018      Follow-up Information    [facility] [employer]. Go to.   Why:  [facility] is in network with [employer] managed care.  Contact information: [address] [place] [address] [phone]          No Known Allergies  Consultations:  Orthopedics    Procedures/Studies: Dg Elbow 2 Views Right  Result Date: 05/04/2018 CLINICAL DATA:  Right elbow pain, no known injury EXAM: RIGHT ELBOW - 2 VIEW COMPARISON:  None. FINDINGS: No fracture or dislocation is seen. Displaced elbow joint fat pads on the lateral view, reflecting an elbow joint effusion. Mild soft tissue swelling overlying the olecranon. IMPRESSION: Suspected olecranon bursitis with associated elbow joint effusion. Electronically Signed   By: Charline Bills M.D.   On: 05/04/2018 23:31       Subjective: Patient continue to have right elbow edema and tenderness, decreased range of motion, no nausea or vomiting, no fever or chills.   Discharge Exam: Vitals:   05/09/18 1702 05/10/18 0442  BP: (!) 149/94 (!) 138/96  Pulse: 79 79  Resp: 18 20  Temp: 98.6 F (37 C) 98.6 F (37 C)  SpO2: 96% 92%   Vitals:   05/08/18 2129 05/09/18 0843 05/09/18 1702 05/10/18 0442  BP:  (!) 133/91 (!) 149/94 (!) 138/96  Pulse:  81 79 79  Resp:  20 18 20   Temp:  98.4 F (36.9 C) 98.6 F (37 C) 98.6 F (37 C)  TempSrc:  Oral Oral Oral  SpO2:  97% 96% 92%  Weight: 123.8 kg   127.8 kg  Height:        General: Not in pain or dyspnea  Neurology: Awake and alert, non focal  E ENT: no pallor, no icterus, oral mucosa moist Cardiovascular: No JVD. S1-S2 present, rhythmic, no gallops, rubs, or murmurs. No lower extremity edema. Pulmonary: vesicular breath sounds bilaterally, adequate air movement, no wheezing, rhonchi or rales. Gastrointestinal. Abdomen flat, no organomegaly, non tender, no rebound or guarding Skin. No rashes Musculoskeletal: right elbow with edema and decrease range of motion, no  erythema or  Increase range of motion.                                     The results of significant diagnostics from this hospitalization (including imaging, microbiology, ancillary and laboratory) are listed below for reference.     Microbiology: Recent Results (from the past 240 hour(s))  Culture, blood (routine x 2)     Status: None (Preliminary result)   Collection Time: 05/05/18  4:30 PM  Result Value Ref Range Status   Specimen Description BLOOD LEFT HAND  Final   Special Requests   Final    BOTTLES DRAWN AEROBIC AND ANAEROBIC Blood Culture adequate volume   Culture   Final    NO GROWTH 4 DAYS Performed at [facility], [address], [place], [place] [address]    Report Status PENDING  Incomplete  Culture, blood (routine x 2)     Status: None (Preliminary result)   Collection Time: 05/05/18  4:35 PM  Result Value Ref Range Status   Specimen Description BLOOD RIGHT HAND  Final   Special Requests   Final    BOTTLES DRAWN AEROBIC AND ANAEROBIC Blood Culture adequate volume   Culture   Final    NO GROWTH 4 DAYS Performed at [facility], [address] [place], [place] [address]    Report Status PENDING  Incomplete     Labs: BNP (last 3 results) No results for input(s): BNP in the last 8760 hours. Basic Metabolic Panel: Recent Labs  Lab 05/04/18 0540 05/05/18 0708 05/06/18 0332 05/07/18 0548 05/08/18 0510 05/10/18 0437  NA 139 136 137 138 138 135  K 3.4* 3.6 3.8 4.1 4.0 4.2  CL 100 99 99 99 100 99  CO2 26 24 26 26 26 25   GLUCOSE 132* 130* 118* 101* 142* 163*  BUN <5* 5* 9 11 13 13   CREATININE 0.83 0.75 0.77 0.73 0.79 0.90  CALCIUM 9.1 9.4 9.3 9.4 9.2 9.7  MG 1.7  --   --   --   --   --    Liver Function Tests: Recent Labs  Lab 05/04/18 0540 05/06/18 0332 05/09/18 0341 05/10/18 0437  AST 276*  272* 91* 200* 172*  ALT 251*  252* 186* 323* 470*  ALKPHOS 45  45 41 42 45  BILITOT 1.2  1.1 1.4* 0.7 0.5  PROT 6.2*  6.4* 7.1 7.4 7.6   ALBUMIN 3.0*  3.0* 3.1* 3.2* 3.5   No results for input(s): LIPASE, AMYLASE in the last 168 hours. No results for input(s): AMMONIA in the last 168 hours. CBC: Recent Labs  Lab 05/06/18 0332 05/07/18 0548 05/08/18 0510  WBC 9.3 8.7 7.8  NEUTROABS 5.6 4.9 4.6  HGB 14.4 14.6 13.8  HCT 40.2 41.9 39.4  MCV 96.4 98.4 97.8  PLT 271 287 329   Cardiac Enzymes: No results for input(s): CKTOTAL, CKMB, CKMBINDEX, TROPONINI in the last 168 hours. BNP: Invalid input(s): POCBNP CBG: No results for input(s): GLUCAP in the last 168 hours. D-Dimer No results for input(s): DDIMER in the last 72 hours. Hgb A1c No results for input(s): HGBA1C in the last 72 hours. Lipid Profile No results for input(s): CHOL, HDL, LDLCALC, TRIG, CHOLHDL, LDLDIRECT in the last 72 hours. Thyroid function studies No results for input(s): TSH, T4TOTAL, T3FREE, THYROIDAB in the last 72 hours.  Invalid input(s): FREET3 Anemia work up No results for input(s): VITAMINB12, FOLATE, FERRITIN, TIBC, IRON, RETICCTPCT in the last 72 hours. Urinalysis No results found for: COLORURINE, APPEARANCEUR, LABSPEC, PHURINE, GLUCOSEU, HGBUR, BILIRUBINUR, KETONESUR, PROTEINUR, UROBILINOGEN, NITRITE, LEUKOCYTESUR Sepsis Labs Invalid input(s): PROCALCITONIN,  WBC,  LACTICIDVEN Microbiology Recent Results (from the past 240 hour(s))  Culture, blood (routine x 2)     Status: None (Preliminary result)   Collection Time: 05/05/18  4:30 PM  Result Value Ref Range Status   Specimen Description BLOOD LEFT HAND  Final   Special Requests   Final    BOTTLES DRAWN AEROBIC AND ANAEROBIC Blood Culture adequate volume   Culture   Final    NO GROWTH 4 DAYS Performed at [facility], [address] [place], [place] [address]    Report Status PENDING  Incomplete  Culture, blood (routine x 2)     Status: None (Preliminary result)   Collection Time: 05/05/18  4:35 PM  Result Value Ref Range Status   Specimen Description BLOOD RIGHT  HAND  Final   Special Requests   Final    BOTTLES DRAWN AEROBIC AND ANAEROBIC Blood Culture adequate volume   Culture  Final    NO GROWTH 4 DAYS Performed at [facility], [address] [place], [place] [address]    Report Status PENDING  Incomplete     Time coordinating discharge: 45 minutes  SIGNED:   Coralie KeensMauricio Daniel Romie Tay, MD  [facility] 05/10/2018, 8:53 AM Pager [phone]  If 7PM-7AM, please contact night-coverage [email or web address] Password [email or web address]

## 2018-07-04 ENCOUNTER — Other Ambulatory Visit: Payer: Self-pay

## 2018-07-04 ENCOUNTER — Encounter: Payer: Self-pay | Admitting: Medical

## 2018-07-04 ENCOUNTER — Ambulatory Visit (INDEPENDENT_AMBULATORY_CARE_PROVIDER_SITE_OTHER): Payer: Managed Care, Other (non HMO) | Admitting: Medical

## 2018-07-04 VITALS — BP 142/90 | HR 80 | Temp 98.7°F | Resp 16 | Ht 71.0 in | Wt 277.2 lb

## 2018-07-04 DIAGNOSIS — F988 Other specified behavioral and emotional disorders with onset usually occurring in childhood and adolescence: Secondary | ICD-10-CM | POA: Diagnosis not present

## 2018-07-04 DIAGNOSIS — F1011 Alcohol abuse, in remission: Secondary | ICD-10-CM | POA: Insufficient documentation

## 2018-07-04 DIAGNOSIS — F429 Obsessive-compulsive disorder, unspecified: Secondary | ICD-10-CM | POA: Diagnosis not present

## 2018-07-04 DIAGNOSIS — G479 Sleep disorder, unspecified: Secondary | ICD-10-CM | POA: Insufficient documentation

## 2018-07-04 MED ORDER — IBUPROFEN 600 MG PO TABS: 600.0000 mg | ORAL_TABLET | Freq: Two times a day (BID) | ORAL | 0 refills | Status: AC

## 2018-07-04 MED ORDER — SERTRALINE HCL 50 MG PO TABS: 50.0000 mg | ORAL_TABLET | Freq: Every day | ORAL | 1 refills | Status: DC

## 2018-07-04 MED ORDER — ALLOPURINOL 100 MG PO TABS: 100.0000 mg | ORAL_TABLET | Freq: Every day | ORAL | 6 refills | Status: DC

## 2018-07-04 MED ORDER — AMPHETAMINE-DEXTROAMPHETAMINE 20 MG PO TABS: 20.0000 mg | ORAL_TABLET | Freq: Two times a day (BID) | ORAL | 0 refills | Status: DC

## 2018-07-04 MED ORDER — QUETIAPINE FUMARATE 100 MG PO TABS: 100.0000 mg | ORAL_TABLET | Freq: Every day | ORAL | 1 refills | Status: DC

## 2018-07-04 MED ORDER — PROPRANOLOL HCL 60 MG PO TABS: 60.0000 mg | ORAL_TABLET | Freq: Three times a day (TID) | ORAL | 1 refills | Status: DC

## 2018-07-04 NOTE — Progress Notes (Signed)
Subjective:     Patient ID: Sean Ibarra, male   DOB: 14-Aug-1985, 33 y.o.   MRN: [id number]  HPI Chief Complaint  Patient presents with  . Med check    NP med refill   Here as a new patient   Has hx/o alcoholism.   Went to rehab facility, [facility] and been sober for 3 months.  He was started on several new medications at the residential treatment facility.   Is on propanolol for anxiety and its not helping anxiety.    Using Seroquel, but still only gets about 4 hours of sleep per night.   Got out of the treatment facility 2.5 months ago.   Single.  Has wife, but they are going through some hard times.   Has 2 children, 6yo and 9yo.   Sees them most weekends.   Works in Psychologist, sport and exercise.  No currently drinking alcohol, no other drug use currently.    He notes problems with sleep for years.   No hx/o snoring, no history of sleep apnea.   No family hx/o sleep apnea, but brother has narcolepsy.  Can go to sleep 10pm, and wake up wide awake at 2am.  No prior sleep evaluation or prior neurology eval, no prior sleep study.  No other prior sleep medications.     He notes when he was younger was on Prozac for OCD.   He can read a paragraph 10 times and not recall any info.   He loses stuff all the times.  Was diagnosed with ADD with the recent treatment program.  His brothers both were diagnosed with ADD as kids.    No prior HTN before the residential facility program.    Liver tests were in the 600s when he went into the treatment program.    Is attending AA program, 3 times per week for group and individual counseling.  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  Would like something to help with gout.  Has long history of gout.  Recent uric acid was over 9  Denies hallucinations, homicidal or suicidal ideation, no history of manic episodes.  He notes that he has given up on alcohol nail and wants to fight to have a good relationship with his kids and  improve things with his wife.  Past Medical History:  Diagnosis Date  . Alcohol abuse   . Anxiety   . Elevated LFTs   . Gout   . Hyperlipidemia   . Hypertension   . Smoker    No current outpatient medications on file prior to visit.   No current facility-administered medications on file prior to visit.    Review of Systems As in subjective      Objective:   Physical Exam BP (!) 142/90   Pulse 80   Temp 98.7 F (37.1 C) (Oral)   Resp 16   Ht 5\' 11"  (1.803 m)   Wt 277 lb 3.2 oz (125.7 kg)   SpO2 98%   BMI 38.66 kg/m   Gen: wd, wn, nad Psych: pleasant good eye contact, answers questions approprietly       Assessment:     Encounter Diagnoses  Name Primary?  . Obsessive-compulsive disorder, unspecified type Yes  . Attention deficit disorder, unspecified hyperactivity presence   . Sleep disturbance   . History of alcohol abuse         Plan:     I reviewed his mood disorder questionnaire,  which was positive, I reviewed his ADD questionnaire and PHQ 9.  I encouraged him to continue to be sober and avoid alcohol, continue AA meetings and counseling 3 days/week  I advise he consider establishing with psychiatry or I may end up needing him to do this depending on how he responds to medications going forward  For now we will make a change.  We will stop Strattera and begin trial of Adderall.  Discussed risk and benefits of medication, proper use of medication.  We will begin allopurinol 100 mg daily and ibuprofen for the next week twice a day, then as needed to help with gout and lowering uric acid  Continue rest of medications as usual, refills as below  Next visit plan labs including comprehensive metabolic panel, CBC, uric acid, thyroid.  Consider sleep study  Follow-up in 2 weeks, sooner.  Sean Ibarra was seen today for med check.  Diagnoses and all orders for this visit:  Obsessive-compulsive disorder, unspecified type  Attention deficit disorder,  unspecified hyperactivity presence  Sleep disturbance  History of alcohol abuse  Other orders -     amphetamine-dextroamphetamine (ADDERALL) 20 MG tablet; Take 1 tablet (20 mg total) by mouth 2 (two) times daily. -     allopurinol (ZYLOPRIM) 100 MG tablet; Take 1 tablet (100 mg total) by mouth daily. -     ibuprofen (ADVIL,MOTRIN) 600 MG tablet; Take 1 tablet (600 mg total) by mouth 2 (two) times daily. -     sertraline (ZOLOFT) 50 MG tablet; Take 1 tablet (50 mg total) by mouth daily. -     QUEtiapine (SEROQUEL) 100 MG tablet; Take 1 tablet (100 mg total) by mouth at bedtime. -     propranolol (INDERAL) 60 MG tablet; Take 1 tablet (60 mg total) by mouth 3 (three) times daily.

## 2018-07-08 ENCOUNTER — Telehealth: Payer: Self-pay | Admitting: Medical

## 2018-07-08 NOTE — Telephone Encounter (Signed)
Received requested records from [facility]. Sending back for review.

## 2018-07-11 NOTE — Telephone Encounter (Signed)
Patient has an appointment 07-18-18.

## 2018-07-14 ENCOUNTER — Encounter: Payer: Self-pay | Admitting: Medical

## 2018-07-18 ENCOUNTER — Encounter: Payer: Self-pay | Admitting: Medical

## 2018-07-18 ENCOUNTER — Ambulatory Visit (INDEPENDENT_AMBULATORY_CARE_PROVIDER_SITE_OTHER): Payer: Managed Care, Other (non HMO) | Admitting: Medical

## 2018-07-18 ENCOUNTER — Other Ambulatory Visit: Payer: Self-pay

## 2018-07-18 VITALS — Ht 72.0 in | Wt 260.0 lb

## 2018-07-18 DIAGNOSIS — R7989 Other specified abnormal findings of blood chemistry: Secondary | ICD-10-CM | POA: Insufficient documentation

## 2018-07-18 DIAGNOSIS — F1011 Alcohol abuse, in remission: Secondary | ICD-10-CM

## 2018-07-18 DIAGNOSIS — F429 Obsessive-compulsive disorder, unspecified: Secondary | ICD-10-CM | POA: Diagnosis not present

## 2018-07-18 DIAGNOSIS — R945 Abnormal results of liver function studies: Secondary | ICD-10-CM

## 2018-07-18 DIAGNOSIS — G479 Sleep disorder, unspecified: Secondary | ICD-10-CM

## 2018-07-18 DIAGNOSIS — Z720 Tobacco use: Secondary | ICD-10-CM

## 2018-07-18 DIAGNOSIS — F988 Other specified behavioral and emotional disorders with onset usually occurring in childhood and adolescence: Secondary | ICD-10-CM

## 2018-07-18 DIAGNOSIS — E790 Hyperuricemia without signs of inflammatory arthritis and tophaceous disease: Secondary | ICD-10-CM | POA: Diagnosis not present

## 2018-07-18 MED ORDER — ALLOPURINOL 100 MG PO TABS: 100.0000 mg | ORAL_TABLET | Freq: Every day | ORAL | 0 refills | Status: DC

## 2018-07-18 MED ORDER — SERTRALINE HCL 100 MG PO TABS: 100.0000 mg | ORAL_TABLET | Freq: Every day | ORAL | 0 refills | Status: DC

## 2018-07-18 NOTE — Progress Notes (Signed)
Documentation for virtual telephone encounter.  The patient was located at work, on site in [place], [place] The provider was located in the office. The patient did consent to this visit and is aware of possible charges through their insurance for this visit.  The other persons participating in this telemedicine service were none. Time spent on call was 12 minutes and in review of previous records >20 minutes total.  This virtual service is not related to other E/M service within previous 7 days.   Subjective:     Patient ID: Sean Ibarra, male   DOB: [birth year], 33 y.o.   MRN: [id number]  HPI Chief Complaint  Patient presents with  . follow up    follow up   This visit was done for med check.   I saw him as a new patient recently on 07/04/2018.  Since last visit he notes that he has not seen any real change in his obsessive thoughts on Adderall.  We had switched to Adderall from Strattera as this was not helping either.  He is taking allopurinol started last visit and has not had any worse gout issues since last visit.  He is working at a time doing for the next 2 weeks.  He has not drank any alcohol in the past 80 to 90 days.  He is doing alcohol Anonymous visits virtually, counseling.  He has no new complaints other than possibly modifying medicine to help with obsessive thoughts.  From his last visit, new patient visit, we discussed the following  Has hx/o alcoholism.   He had recently graduated from rehab facility, [facility] and been sober for 3 months.  He was started on several new medications at the residential treatment facility.   Is on propanolol for anxiety but it was not helping anxiety.    Using Seroquel, but still only gets about 4 hours of sleep per night.     Single.  Has wife, but they are going through some hard times.   Has 2 children, 6yo and 9yo.   Sees them most weekends.   Works in Psych[employer], sport and exercise.  No currently drinking alcohol, no other drug use currently.    He  notes problems with sleep for years.   No hx/o snoring, no history of sleep apnea.   No family hx/o sleep apnea, but brother has narcolepsy.  Can go to sleep 10pm, and wake up wide awake at 2am.  No prior sleep evaluation or prior neurology eval, no prior sleep study.  No other prior sleep medications.     He notes when he was younger was on Prozac for OCD, up to 80mg  on Prozac at one point.   He can read a paragraph 10 times and not recall any info.   He loses stuff all the times.  Was diagnosed with ADD with the recent treatment program.  His brothers both were diagnosed with ADD as kids.    No prior HTN before the residential facility program.    Liver tests were in the 600s when he went into the treatment program.    Is attending AA program, 3 times per week for group and individual counseling.  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  Would like something to help with gout.  Has long history of gout.  Recent uric acid was over 9  Denies hallucinations, homicidal or suicidal ideation, no history of manic episodes.  He notes that he has given up  on alcohol now and wants to fight to have a good relationship with his kids and improve things with his wife.  Past Medical History:  Diagnosis Date  . Alcohol abuse   . Anxiety   . Elevated LFTs   . Gout   . Hyperlipidemia   . Hypertension   . Smoker    Current Outpatient Medications on File Prior to Visit  Medication Sig Dispense Refill  . ibuprofen (ADVIL,MOTRIN) 600 MG tablet Take 1 tablet (600 mg total) by mouth 2 (two) times daily. 60 tablet 0  . propranolol (INDERAL) 60 MG tablet Take 1 tablet (60 mg total) by mouth 3 (three) times daily. 90 tablet 1  . QUEtiapine (SEROQUEL) 100 MG tablet Take 1 tablet (100 mg total) by mouth at bedtime. 30 tablet 1   No current facility-administered medications on file prior to visit.    Review of Systems As in subjective      Objective:   Physical Exam Ht  6' (1.829 m)   Wt 260 lb (117.9 kg)   BMI 35.26 kg/m   Psych: pleasant, answers questions approprietly       Assessment:     Encounter Diagnoses  Name Primary?  . Attention deficit disorder, unspecified hyperactivity presence Yes  . Obsessive-compulsive disorder, unspecified type   . Tobacco abuse   . Elevated uric acid in blood   . Sleep disturbance   . History of alcohol abuse         Plan:      I encouraged him to continue to be sober and avoid alcohol, continue AA meetings and counseling, currently virtually, 3 days/week  I advise he consider establishing with psychiatry or I may end up needing him to do this depending on how he responds to medications going forward  The only medication changes I made today was that I bumped up his sertraline 200 mg daily and stopped Adderall as this does not seem to be helping.  Of note when I reviewed his records from [facility] that came in, they advised he stay away from stimulants for now.  He was new to me last visit and I did not have his prior records when he came in.  Advised he continue allopurinol for gout and elevated uric acid  Regarding elevated liver test and gout, we will defer labs until 4 to 6 weeks from now due to coronavirus stay at home measures  Next visit plan labs including comprehensive metabolic panel, CBC, uric acid, thyroid.  Consider sleep study  Follow-up in 3-4 weeks by phone, and plan labs in 4-6 weeks  Sean Ibarra was seen today for follow up.  Diagnoses and all orders for this visit:  Attention deficit disorder, unspecified hyperactivity presence  Obsessive-compulsive disorder, unspecified type  Tobacco abuse  Elevated uric acid in blood  Sleep disturbance  History of alcohol abuse  Other orders -     sertraline (ZOLOFT) 100 MG tablet; Take 1 tablet (100 mg total) by mouth daily. -     allopurinol (ZYLOPRIM) 100 MG tablet; Take 1 tablet (100 mg total) by mouth daily.

## 2018-08-03 ENCOUNTER — Telehealth: Payer: Self-pay | Admitting: Medical

## 2018-08-03 NOTE — Telephone Encounter (Signed)
Pt states after he stopped taking the Adderall he realized that it was indeed working and is helping him so he started taking it again,  It is helping him and he wants to continue and needs refill to [facility]

## 2018-08-04 ENCOUNTER — Other Ambulatory Visit: Payer: Self-pay | Admitting: Medical

## 2018-08-04 MED ORDER — AMPHETAMINE-DEXTROAMPHETAMINE 20 MG PO TABS: 20.0000 mg | ORAL_TABLET | Freq: Two times a day (BID) | ORAL | 0 refills | Status: DC

## 2018-08-05 NOTE — Telephone Encounter (Signed)
Med sent.   F/u in person visit in 4-6 weeks.

## 2018-08-05 NOTE — Telephone Encounter (Signed)
Patient notified and appointment scheduled. 

## 2018-08-06 NOTE — Telephone Encounter (Signed)
done

## 2018-08-09 ENCOUNTER — Ambulatory Visit (INDEPENDENT_AMBULATORY_CARE_PROVIDER_SITE_OTHER): Payer: Managed Care, Other (non HMO) | Admitting: Family Medicine

## 2018-08-09 ENCOUNTER — Other Ambulatory Visit: Payer: Self-pay

## 2018-08-09 VITALS — Ht 72.0 in | Wt 260.0 lb

## 2018-08-09 DIAGNOSIS — R0602 Shortness of breath: Secondary | ICD-10-CM

## 2018-08-09 NOTE — Progress Notes (Signed)
   Subjective:    Patient ID: Sean Ibarra, male    DOB: May 18, 1985, 33 y.o.   MRN: [id number]  HPI Documentation for virtual telephone encounter.  Documentation for virtual audio and video telecommunications through [employer] encounter:  Interactive audio and video telecommunications were attempted between this provider and patient, however he did not have access to video capability.  We continued and completed visit with audio only.  The patient was located at home. The provider was located in the office. The patient did consent to this visit and is aware of possible charges through their insurance for this visit.  The other persons participating in this telemedicine service were none. This virtual service is not related to other E/M service within previous 7 days. He woke up this morning and states that he has been short of breath for the last couple of hours.  No associated fever, sore throat, earache, malaise, coughing.  No taste or smell changes.  Does have a previous history of difficulty with headache that usually occurs later on in the day.  He also has had intermittent loose stools.     Review of Systems     Objective:   Physical Exam Alert and in no distress otherwise not examined       Assessment & Plan:  Shortness of breath Explained that his symptoms were cannot clinically significant enough at this time to do anything more than watchful waiting.  Did express the need for him to call us tomorrow to let us know how he is doing.  He was comfortable with that.

## 2018-08-15 ENCOUNTER — Encounter: Payer: Self-pay | Admitting: Medical

## 2018-08-15 ENCOUNTER — Ambulatory Visit (INDEPENDENT_AMBULATORY_CARE_PROVIDER_SITE_OTHER): Payer: Managed Care, Other (non HMO) | Admitting: Medical

## 2018-08-15 ENCOUNTER — Other Ambulatory Visit: Payer: Self-pay

## 2018-08-15 VITALS — Ht 72.0 in | Wt 260.0 lb

## 2018-08-15 DIAGNOSIS — I10 Essential (primary) hypertension: Secondary | ICD-10-CM

## 2018-08-15 DIAGNOSIS — R05 Cough: Secondary | ICD-10-CM | POA: Insufficient documentation

## 2018-08-15 DIAGNOSIS — R7989 Other specified abnormal findings of blood chemistry: Secondary | ICD-10-CM | POA: Insufficient documentation

## 2018-08-15 DIAGNOSIS — R0602 Shortness of breath: Secondary | ICD-10-CM

## 2018-08-15 DIAGNOSIS — F17200 Nicotine dependence, unspecified, uncomplicated: Secondary | ICD-10-CM

## 2018-08-15 DIAGNOSIS — R062 Wheezing: Secondary | ICD-10-CM | POA: Insufficient documentation

## 2018-08-15 DIAGNOSIS — R059 Cough, unspecified: Secondary | ICD-10-CM | POA: Insufficient documentation

## 2018-08-15 MED ORDER — ALBUTEROL SULFATE HFA 108 (90 BASE) MCG/ACT IN AERS: 2.0000 | INHALATION_SPRAY | Freq: Four times a day (QID) | RESPIRATORY_TRACT | 0 refills | Status: AC | PRN

## 2018-08-15 NOTE — Progress Notes (Signed)
This visit type was conducted due to national recommendations for restrictions regarding the COVID-19 Pandemic (e.g. social distancing) in an effort to limit this patient's exposure and mitigate transmission in our community.  Due to their co-morbid illnesses, this patient is at least at moderate risk for complications without adequate follow up.  This format is felt to be most appropriate for this patient at this time.    Documentation for virtual audio and video telecommunications through Zoom encounter:  The patient was located at home. The provider was located in the office. The patient did consent to this visit and is aware of possible charges through their insurance for this visit.  The other persons participating in this telemedicine service were none. Time spent on call was 15 minutes and in review of previous records >18 minutes total.  This virtual service is not related to other E/M service within previous 7 days.   Subjective: Chief Complaint  Patient presents with  . recheck    recheck breathing, SOB cough, diarrhea, lite headed.     Virtual visit today for dyspnea.   He reports about 6-day history of shortness of breath, some ear pressure, some head congestion, lack of appetite.  Mouth is dry.  At times feels like he cannot catch his breath.  Sometimes lightheaded, totally winded. he reports some sweats, some chills.   He denies fever, sore throat, teeth pain, sinus pain, headache, nausea, vomiting, diarrhea.  No change in smell or taste.  No sick contacts, No COVID19 exposure.    Using Tyelnol cold and flu recently, and used some zyrtec which has helped some.  No hx/o asthma or lung disease.   In the past with allergies, nose runs bad.  He also reports some runny nose, some cough.  Denies calf pain or swelling.  He is a smoker, smokes about half pack per day.  Denies history of heart problems himself but there is heart disease in the family.  No swelling in the legs, no pain in  the jaw or left arm.  He has no way of checking BP/pulse.  No hx/o blood pressure.  No recent injury or trauma to  Body.     Past Medical History:  Diagnosis Date  . Alcohol abuse   . Anxiety   . Elevated LFTs   . Gout   . Hyperlipidemia   . Hypertension   . Smoker    Current Outpatient Medications on File Prior to Visit  Medication Sig Dispense Refill  . allopurinol (ZYLOPRIM) 100 MG tablet Take 1 tablet (100 mg total) by mouth daily. 30 tablet 0  . amphetamine-dextroamphetamine (ADDERALL) 20 MG tablet Take 1 tablet (20 mg total) by mouth 2 (two) times daily. 60 tablet 0  . ibuprofen (ADVIL,MOTRIN) 600 MG tablet Take 1 tablet (600 mg total) by mouth 2 (two) times daily. 60 tablet 0  . propranolol (INDERAL) 60 MG tablet Take 1 tablet (60 mg total) by mouth 3 (three) times daily. 90 tablet 1  . QUEtiapine (SEROQUEL) 100 MG tablet Take 1 tablet (100 mg total) by mouth at bedtime. 30 tablet 1  . sertraline (ZOLOFT) 100 MG tablet Take 1 tablet (100 mg total) by mouth daily. 30 tablet 0   No current facility-administered medications on file prior to visit.    ROS as in subjective    Objective: Ht 6' (1.829 m)   Wt 260 lb (117.9 kg)   BMI 35.26 kg/m   Gen: nad, not dyspneic on the phone , no  wheezing   Assessment: Encounter Diagnoses  Name Primary?  . SOB (shortness of breath) Yes  . Cough   . Smoker   . Wheezing   . Abnormal thyroid blood test   . Benign essential HTN      Plan: We discussed his symptoms and concerns.  We discussed possible causes.  We are limited with virtual visit and discussed the limitations.  He feels more concerned about coronavirus and respiratory tract infection.  We discussed other possible causes of shortness of breath, some which could be serious.  I recommended he go to the emergency department for evaluation given the uncertainty of his symptoms.  He declines at this point.  We discussed other differential that could include acute coronary  syndrome, pneumothorax, pneumonia, PE, respiratory tract infection, COVID-19 infection.  I did call in inhaler to help his shortness of breath as he feels is more related to respiratory tract infection.  We discussed proper use of inhaler.  Advise rest, advised quarantine at home for the next 7 to 14 days with the plan to not return to work until at least 7 days and 72 hours of symptom free has passed.  Advised if any chest pain, left arm pain, jaw pain, calf pain or swelling, hemoptysis, or other worsening symptoms in the next few days to go to emergency department.  Advised he stay at home including not working or not interacting with other people as above.  He will try rest, inhaler, and can continue the over-the-counter remedies he is using for congestion and will call back if not seeing some improvement in the next 24 hours  We discussed my chart COVID companion home monitoring which she agreed to.  Sean Ibarra was seen today for recheck.  Diagnoses and all orders for this visit:  SOB (shortness of breath) -     MYCHART COVID-19 HOME MONITORING PROGRAM -     Temperature monitoring; Future  Cough -     MYCHART COVID-19 HOME MONITORING PROGRAM -     Temperature monitoring; Future  Smoker  Wheezing  Abnormal thyroid blood test  Benign essential HTN  Other orders -     albuterol (VENTOLIN HFA) 108 (90 Base) MCG/ACT inhaler; Inhale 2 puffs into the lungs every 6 (six) hours as needed for wheezing or shortness of breath.

## 2018-08-22 ENCOUNTER — Other Ambulatory Visit: Payer: Self-pay | Admitting: Medical

## 2018-08-22 ENCOUNTER — Telehealth: Payer: Self-pay | Admitting: Family Medicine

## 2018-08-22 MED ORDER — SERTRALINE HCL 100 MG PO TABS: 100.0000 mg | ORAL_TABLET | Freq: Every day | ORAL | 0 refills | Status: DC

## 2018-08-22 MED ORDER — ALLOPURINOL 100 MG PO TABS: 100.0000 mg | ORAL_TABLET | Freq: Every day | ORAL | 0 refills | Status: DC

## 2018-08-22 MED ORDER — QUETIAPINE FUMARATE 100 MG PO TABS: 100.0000 mg | ORAL_TABLET | Freq: Every day | ORAL | 0 refills | Status: DC

## 2018-08-22 MED ORDER — PROPRANOLOL HCL 60 MG PO TABS: 60.0000 mg | ORAL_TABLET | Freq: Three times a day (TID) | ORAL | 0 refills | Status: DC

## 2018-08-22 NOTE — Telephone Encounter (Signed)
Pt called wants refill of Sertraline [employer].  appt end of month

## 2018-08-22 NOTE — Telephone Encounter (Signed)
Patient has appt at end of month.

## 2018-08-22 NOTE — Telephone Encounter (Signed)
Ok, meds sent, but make sure he has In Office visit planned   FYI - I plan to do a urine drug screen on that visit.

## 2018-09-02 ENCOUNTER — Telehealth: Payer: Self-pay | Admitting: Medical

## 2018-09-02 MED ORDER — AMPHETAMINE-DEXTROAMPHETAMINE 20 MG PO TABS: 20.0000 mg | ORAL_TABLET | Freq: Two times a day (BID) | ORAL | 0 refills | Status: DC

## 2018-09-02 NOTE — Telephone Encounter (Signed)
Pt called for refills of adderall. He does have a appt scheduled. Please send to [facility]. Pt can be reached at [phone]. Sending to [facility] as Vinc[facility] Hews is not in and pt will be out by Monday.

## 2018-09-12 ENCOUNTER — Other Ambulatory Visit: Payer: Self-pay | Admitting: Medical

## 2018-09-13 NOTE — Telephone Encounter (Signed)
Is this ok to refill?  

## 2018-09-14 ENCOUNTER — Ambulatory Visit (INDEPENDENT_AMBULATORY_CARE_PROVIDER_SITE_OTHER): Payer: Managed Care, Other (non HMO) | Admitting: Medical

## 2018-09-14 ENCOUNTER — Encounter: Payer: Self-pay | Admitting: Medical

## 2018-09-14 ENCOUNTER — Other Ambulatory Visit: Payer: Self-pay

## 2018-09-14 VITALS — Temp 98.6°F | Ht 72.0 in | Wt 240.0 lb

## 2018-09-14 DIAGNOSIS — R945 Abnormal results of liver function studies: Secondary | ICD-10-CM | POA: Diagnosis not present

## 2018-09-14 DIAGNOSIS — Z79899 Other long term (current) drug therapy: Secondary | ICD-10-CM

## 2018-09-14 DIAGNOSIS — G479 Sleep disorder, unspecified: Secondary | ICD-10-CM

## 2018-09-14 DIAGNOSIS — I10 Essential (primary) hypertension: Secondary | ICD-10-CM

## 2018-09-14 DIAGNOSIS — R7989 Other specified abnormal findings of blood chemistry: Secondary | ICD-10-CM | POA: Diagnosis not present

## 2018-09-14 DIAGNOSIS — F429 Obsessive-compulsive disorder, unspecified: Secondary | ICD-10-CM

## 2018-09-14 DIAGNOSIS — F988 Other specified behavioral and emotional disorders with onset usually occurring in childhood and adolescence: Secondary | ICD-10-CM | POA: Diagnosis not present

## 2018-09-14 DIAGNOSIS — F1011 Alcohol abuse, in remission: Secondary | ICD-10-CM

## 2018-09-14 DIAGNOSIS — E790 Hyperuricemia without signs of inflammatory arthritis and tophaceous disease: Secondary | ICD-10-CM

## 2018-09-14 DIAGNOSIS — F17200 Nicotine dependence, unspecified, uncomplicated: Secondary | ICD-10-CM

## 2018-09-14 MED ORDER — ALLOPURINOL 100 MG PO TABS: 100.0000 mg | ORAL_TABLET | Freq: Every day | ORAL | 0 refills | Status: DC

## 2018-09-14 MED ORDER — AMPHETAMINE-DEXTROAMPHETAMINE 20 MG PO TABS: 20.0000 mg | ORAL_TABLET | Freq: Two times a day (BID) | ORAL | 0 refills | Status: DC

## 2018-09-14 MED ORDER — PROPRANOLOL HCL 60 MG PO TABS: 60.0000 mg | ORAL_TABLET | Freq: Three times a day (TID) | ORAL | 0 refills | Status: DC

## 2018-09-14 MED ORDER — QUETIAPINE FUMARATE 100 MG PO TABS: 100.0000 mg | ORAL_TABLET | Freq: Every day | ORAL | 0 refills | Status: DC

## 2018-09-14 MED ORDER — SERTRALINE HCL 100 MG PO TABS: 100.0000 mg | ORAL_TABLET | Freq: Every day | ORAL | 0 refills | Status: DC

## 2018-09-14 NOTE — Progress Notes (Signed)
Order faxed to [facility]

## 2018-09-14 NOTE — Progress Notes (Signed)
documentation for virtual tele visit encounter.  The patient was located at home The provider was located in the office. The patient did consent to this visit and is aware of possible charges through their insurance for this visit.  The other persons participating in this telemedicine service were none. Time spent on call was 15 minutes and in review of previous records >20 minutes total.  This virtual service is not related to other E/M service within previous 7 days.   Subjective:     Patient ID: Sean Ibarra, male   DOB: 10/01/1985, 33 y.o.   MRN: [id number]  HPI Chief Complaint  Patient presents with  . med check    med check    This visit was done for med check.   I saw him as a new patient on 07/04/2018.  He has a history of alcohol abuse, was in a residential treatment facility earlier in the year, just before January.  He has a history of attention deficit problems, obsessive compulsive disorder.  Given his history of alcohol abuse he had been living apart from his wife and 2 young kids.  Since coming out of treatment in January he is continued to do well.  He reports no alcohol use since before the treatment program.  He denies any drug use.  He works full-time out of town often but still [employer].  He sees his kids on the weekends typically.  He has 2 young children, Lilly 8 6 in [place] age 56.  He and wife have not reconciled yet but she still cannot trust him yet.  He would like to get back in with counseling to work things out with his wife.  He does attend virtual AA meetings 3 times a week still.  Since last visit no medication complaints things are going fine.  Medication is working fine.  The Adderall continues to do well to help him stay focused.  He is compliant with other medications list today.  He tried Theatre manager prior early in year and did not help.  He takes propanolol for anxiety, Seroquel for mood and anxiety.  Also Seroquel for sleep.  He notes problems with  sleep for years.   No hx/o snoring, no history of sleep apnea.   No family hx/o sleep apnea, but brother has narcolepsy.  Can go to sleep 10pm, and wake up wide awake at 2am.  No prior sleep evaluation or prior neurology eval, no prior sleep study.  No other prior sleep medications.     He does note ongoing problems with insomnia despite his medications.  She notes that he will wake up several times during the night, tosses and turns.  No history of snoring, no witnessed apnea.  No prior HTN before the residential facility program.    Liver tests were in the 600s when he went into the treatment program.    Denies hallucinations, homicidal or suicidal ideation, no history of manic episodes.    Past Medical History:  Diagnosis Date  . Alcohol abuse   . Anxiety   . Elevated LFTs   . Gout   . Hyperlipidemia   . Hypertension   . Smoker    Current Outpatient Medications on File Prior to Visit  Medication Sig Dispense Refill  . albuterol (VENTOLIN HFA) 108 (90 Base) MCG/ACT inhaler Inhale 2 puffs into the lungs every 6 (six) hours as needed for wheezing or shortness of breath. 1 Inhaler 0  . ibuprofen (ADVIL,MOTRIN) 600 MG tablet Take 1  tablet (600 mg total) by mouth 2 (two) times daily. 60 tablet 0   No current facility-administered medications on file prior to visit.    Review of Systems As in subjective      Objective:   Physical Exam Temp 98.6 F (37 C) (Oral)   Ht 6' (1.829 m)   Wt 240 lb (108.9 kg)   BMI 32.55 kg/m  Gen: wd, wn, nad Psych: pleasant, answers questions approprietly       Assessment:     Encounter Diagnoses  Name Primary?  . Attention deficit disorder, unspecified hyperactivity presence Yes  . History of alcohol abuse   . Abnormal thyroid blood test   . Elevated LFTs   . Elevated uric acid in blood   . Obsessive-compulsive disorder, unspecified type   . Smoker   . Benign essential HTN   . High risk medication use   . Sleep disturbance          Plan:      I encouraged him to continue to be sober and avoid alcohol, continue AA meetings and counseling, currently virtually, 3 days/week  I advise he consider establishing with individual or marital counseling.  c/t sertraline 200 mg daily and Adderall 20mg  BID, c/t Propranolol 60mg  TID for anxiety.   C/t Seroquel for mood and sleep.    Advised he continue allopurinol for gout and elevated uric acid  Regarding elevated liver test and gout, return soon for labs  Insomnia, sleep disturbance - refer for sleep study.   Sean NeedleMichael was seen today for med check.  Diagnoses and all orders for this visit:  Attention deficit disorder, unspecified hyperactivity presence -     Urine Drug Panel 7; Future -     CBC; Future  History of alcohol abuse  Abnormal thyroid blood test -     TSH; Future  Elevated LFTs -     Comprehensive metabolic panel; Future  Elevated uric acid in blood -     Uric acid; Future  Obsessive-compulsive disorder, unspecified type  Smoker  Benign essential HTN  High risk medication use -     Comprehensive metabolic panel; Future -     Urine Drug Panel 7; Future -     CBC; Future -     TSH; Future  Sleep disturbance  Other orders -     sertraline (ZOLOFT) 100 MG tablet; Take 1 tablet (100 mg total) by mouth daily. -     QUEtiapine (SEROQUEL) 100 MG tablet; Take 1 tablet (100 mg total) by mouth at bedtime. -     propranolol (INDERAL) 60 MG tablet; Take 1 tablet (60 mg total) by mouth 3 (three) times daily. -     amphetamine-dextroamphetamine (ADDERALL) 20 MG tablet; Take 1 tablet (20 mg total) by mouth 2 (two) times daily. -     allopurinol (ZYLOPRIM) 100 MG tablet; Take 1 tablet (100 mg total) by mouth daily.        F/u soon for labs

## 2018-09-19 ENCOUNTER — Other Ambulatory Visit: Payer: Self-pay | Admitting: Medical

## 2018-09-19 NOTE — Telephone Encounter (Signed)
Is this ok to refill?  

## 2018-09-30 ENCOUNTER — Other Ambulatory Visit: Payer: Managed Care, Other (non HMO)

## 2018-10-14 ENCOUNTER — Other Ambulatory Visit: Payer: Self-pay

## 2018-10-14 ENCOUNTER — Other Ambulatory Visit: Payer: Managed Care, Other (non HMO)

## 2018-10-14 NOTE — Telephone Encounter (Signed)
Pt called to cancel his lab visit and has requested refills on the pending medications. Please advise due to provider being out of the office.

## 2018-10-14 NOTE — Telephone Encounter (Signed)
Please send to [facility] and he can refill Monday. Thanks.

## 2018-10-17 NOTE — Telephone Encounter (Signed)
Go ahead and send in these refills but get in THIS week for fasting labs

## 2018-10-18 ENCOUNTER — Other Ambulatory Visit: Payer: Self-pay | Admitting: Medical

## 2018-10-18 ENCOUNTER — Telehealth: Payer: Self-pay

## 2018-10-18 MED ORDER — PROPRANOLOL HCL 60 MG PO TABS: 60.0000 mg | ORAL_TABLET | Freq: Three times a day (TID) | ORAL | 0 refills | Status: DC

## 2018-10-18 MED ORDER — ALLOPURINOL 100 MG PO TABS: 100.0000 mg | ORAL_TABLET | Freq: Every day | ORAL | 0 refills | Status: DC

## 2018-10-18 MED ORDER — QUETIAPINE FUMARATE 100 MG PO TABS: 100.0000 mg | ORAL_TABLET | Freq: Every day | ORAL | 0 refills | Status: DC

## 2018-10-18 MED ORDER — SERTRALINE HCL 100 MG PO TABS: 100.0000 mg | ORAL_TABLET | Freq: Every day | ORAL | 0 refills | Status: DC

## 2018-10-18 NOTE — Telephone Encounter (Signed)
Patient made appt for 10-20-18 at 2

## 2018-10-18 NOTE — Telephone Encounter (Signed)
Is this ok to refill?  

## 2018-10-18 NOTE — Telephone Encounter (Signed)
Patient called and wants to get refills on his medications sent to his pharmacy.

## 2018-10-18 NOTE — Telephone Encounter (Signed)
He made an appt for 10-20-18 at 2

## 2018-10-18 NOTE — Telephone Encounter (Signed)
I sent the ones from yesterday, get in this week for fasting labs.    Cyndi FYI - I will not refill his controlled substances until he comes in this week for labs, so he really needs to come in this week for the labs.

## 2018-10-19 NOTE — Telephone Encounter (Signed)
Is this ok to refill?  

## 2018-10-19 NOTE — Telephone Encounter (Signed)
I sent these yesterday!   He still needs to come in for labs THIS week!!

## 2018-10-20 ENCOUNTER — Other Ambulatory Visit: Payer: Self-pay | Admitting: Medical

## 2018-10-20 ENCOUNTER — Other Ambulatory Visit (INDEPENDENT_AMBULATORY_CARE_PROVIDER_SITE_OTHER): Payer: Managed Care, Other (non HMO)

## 2018-10-20 ENCOUNTER — Other Ambulatory Visit: Payer: Self-pay

## 2018-10-20 DIAGNOSIS — R7989 Other specified abnormal findings of blood chemistry: Secondary | ICD-10-CM

## 2018-10-20 DIAGNOSIS — Z79899 Other long term (current) drug therapy: Secondary | ICD-10-CM

## 2018-10-20 DIAGNOSIS — E790 Hyperuricemia without signs of inflammatory arthritis and tophaceous disease: Secondary | ICD-10-CM

## 2018-10-20 DIAGNOSIS — F988 Other specified behavioral and emotional disorders with onset usually occurring in childhood and adolescence: Secondary | ICD-10-CM

## 2018-10-20 MED ORDER — AMPHETAMINE-DEXTROAMPHETAMINE 20 MG PO TABS: 20.0000 mg | ORAL_TABLET | Freq: Two times a day (BID) | ORAL | 0 refills | Status: AC

## 2018-10-21 LAB — COMPREHENSIVE METABOLIC PANEL
ALT: 32 IU/L (ref 0–44)
AST: 25 IU/L (ref 0–40)
Albumin/Globulin Ratio: 1.7 (ref 1.2–2.2)
Albumin: 5 g/dL (ref 4.0–5.0)
Alkaline Phosphatase: 44 IU/L (ref 39–117)
BUN/Creatinine Ratio: 10 (ref 9–20)
BUN: 10 mg/dL (ref 6–20)
Bilirubin Total: 0.5 mg/dL (ref 0.0–1.2)
CO2: 25 mmol/L (ref 20–29)
Calcium: 10 mg/dL (ref 8.7–10.2)
Chloride: 99 mmol/L (ref 96–106)
Creatinine, Ser: 1 mg/dL (ref 0.76–1.27)
GFR calc Af Amer: 113 mL/min/{1.73_m2} (ref >59)
GFR calc non Af Amer: 98 mL/min/{1.73_m2} (ref >59)
Globulin, Total: 2.9 g/dL (ref 1.5–4.5)
Glucose: 80 mg/dL (ref 65–99)
Potassium: 4.4 mmol/L (ref 3.5–5.2)
Sodium: 140 mmol/L (ref 134–144)
Total Protein: 7.9 g/dL (ref 6.0–8.5)

## 2018-10-21 LAB — CBC
Hematocrit: 47 % (ref 37.5–51.0)
Hemoglobin: 16.2 g/dL (ref 13.0–17.7)
MCH: 32.4 pg (ref 26.6–33.0)
MCHC: 34.5 g/dL (ref 31.5–35.7)
MCV: 94 fL (ref 79–97)
Platelets: 332 10*3/uL (ref 150–450)
RBC: 5 x10E6/uL (ref 4.14–5.80)
RDW: 15.2 % (ref 11.6–15.4)
WBC: 11.3 10*3/uL — ABNORMAL HIGH (ref 3.4–10.8)

## 2018-10-21 LAB — URIC ACID: Uric Acid: 7.7 mg/dL (ref 3.7–8.6)

## 2018-10-21 LAB — TSH: TSH: 2.6 u[IU]/mL (ref 0.450–4.500)

## 2018-10-24 ENCOUNTER — Other Ambulatory Visit: Payer: Self-pay | Admitting: Medical

## 2018-10-24 ENCOUNTER — Encounter: Payer: Self-pay | Admitting: Medical

## 2018-10-24 DIAGNOSIS — D72829 Elevated white blood cell count, unspecified: Secondary | ICD-10-CM

## 2018-10-24 LAB — URINE DRUG PANEL 7
Amphetamines, Urine: NEGATIVE ng/mL
Barbiturate Quant, Ur: NEGATIVE ng/mL
Benzodiazepine Quant, Ur: NEGATIVE ng/mL
Cannabinoid Quant, Ur: NEGATIVE ng/mL
Cocaine (Metab.): NEGATIVE ng/mL
Opiate Quant, Ur: NEGATIVE ng/mL
PCP Quant, Ur: NEGATIVE ng/mL

## 2018-11-12 ENCOUNTER — Other Ambulatory Visit: Payer: Self-pay | Admitting: Medical

## 2018-11-14 NOTE — Telephone Encounter (Signed)
Is this ok to refill?  

## 2018-11-15 NOTE — Telephone Encounter (Signed)
Please call.   I received refill requests on 3 medications: Zoloft, propanolol and Seroquel.  Make sure he reviewed my lab message notes in my chart from last visit.   I had put in the message that he needed to make an appointment to establish with psychiatry for his mental health medications after last visit.  I hope he has done this.  I can no longer prescribe and manage his mental health medications.   If he needs this 30 day supply filled of the 3 medications listed above that is fine, but this will be the last 30 day supply on these.     ( I will NOT refill the stimulant medication as he was negative on drug screen)

## 2018-11-17 NOTE — Telephone Encounter (Signed)
Patient states that he has not establish care with psychiatry.  He wanted you to know that he does take his medications normal but had been out of his adderall for 6 days before having his appointment.   He does need refills.

## 2018-11-18 NOTE — Telephone Encounter (Signed)
He needs to go ahead and establish with psychiatry.   Send in 30 day supply of those meds listed

## 2018-12-12 ENCOUNTER — Other Ambulatory Visit: Payer: Self-pay | Admitting: Medical

## 2018-12-12 NOTE — Telephone Encounter (Signed)
Is this ok to refill?  

## 2018-12-19 ENCOUNTER — Telehealth: Payer: Self-pay | Admitting: Medical

## 2018-12-19 NOTE — Telephone Encounter (Signed)
Pt needs refill on his zoloft pt would like it sent to the [facility], [address]

## 2018-12-22 ENCOUNTER — Telehealth: Payer: Self-pay | Admitting: Medical

## 2018-12-22 NOTE — Telephone Encounter (Signed)
I received a medicine refill request.  Please check with him to see what is the status on him is establishing with psychiatry?  Back in July we asked him to establish with psychiatry

## 2018-12-23 NOTE — Telephone Encounter (Signed)
Pt stated he has not got in with a psychiatrist due to everything going on with COVID and everything else. He stated to tell you that psychiatrist are not seeing you new patients due to [place].

## 2018-12-26 NOTE — Telephone Encounter (Signed)
Please call [facility] psychiatry downtown and either [facility] or [facility] to see if they are taking new patients?

## 2018-12-27 ENCOUNTER — Other Ambulatory Visit: Payer: Self-pay | Admitting: Medical

## 2018-12-27 MED ORDER — QUETIAPINE FUMARATE 100 MG PO TABS: 100.0000 mg | ORAL_TABLET | Freq: Every day | ORAL | 0 refills | Status: DC

## 2018-12-27 MED ORDER — ALLOPURINOL 100 MG PO TABS: 100.0000 mg | ORAL_TABLET | Freq: Every day | ORAL | 0 refills | Status: AC

## 2018-12-27 MED ORDER — SERTRALINE HCL 100 MG PO TABS: 100.0000 mg | ORAL_TABLET | Freq: Every day | ORAL | 0 refills | Status: DC

## 2018-12-27 NOTE — Telephone Encounter (Signed)
[facility] is taking new patients but they will need demographic and and patients notes before scheduling an appointment. I believe all offices are still taking new patients.

## 2018-12-27 NOTE — Telephone Encounter (Signed)
Ok.  Please let him know what you have found, and remind him to establish with psychiatry ASAP.

## 2018-12-28 NOTE — Telephone Encounter (Signed)
Unable to reach patient by phone, sent message on mychart for patient to inform us whether he wants to move forward with this referral so we can send over his office notes.

## 2019-01-11 ENCOUNTER — Other Ambulatory Visit: Payer: Self-pay | Admitting: Medical

## 2019-02-10 ENCOUNTER — Telehealth: Payer: Self-pay | Admitting: Medical

## 2019-02-10 ENCOUNTER — Other Ambulatory Visit: Payer: Self-pay | Admitting: Medical

## 2019-02-10 NOTE — Telephone Encounter (Signed)
I sent 15 tablets only refills requested today per pharmacy.  He should have established with a psychiatrist at this point

## 2019-02-20 ENCOUNTER — Telehealth: Payer: Self-pay | Admitting: Medical

## 2019-02-20 NOTE — Telephone Encounter (Signed)
Dismissal letter in guarantor snapshot  °

## 2019-03-15 ENCOUNTER — Telehealth: Payer: Self-pay

## 2019-03-15 ENCOUNTER — Other Ambulatory Visit: Payer: Self-pay | Admitting: Medical

## 2019-03-15 NOTE — Telephone Encounter (Signed)
Pt. Called stating he did not why he only got 15 day supply on his zoloft and seroquel, he now needs refill's on both of them to [facility] [address], I told him he had been dismissed on 02/21/19 and I would have to send a message to you to see if he could get a refill on this. I also had [place] check on why he was dismissed and seems there was a misunderstanding on him owing 40 dollars which he told me he paid, I also let he talk to [place] and she said she was also sending you a message about this pt.

## 2019-03-15 NOTE — Telephone Encounter (Signed)
Please see last few phone encounters in the chart.   On several prior telephone encounters we have advised he ESTABLISH with a psychiatrist.  A psychiatry/mental health professional should be managing his mental health medications now.  I am not a psychiatrist, and I think he needs that part of his care handled by a psychiatry office.   I can work with his other health issues and concerns, not related to mental health issues.    We had advised several times prior when he asked for referrals that he needed to establish with psychiatry.  See prior documentation on this in the chart record.   One of the recent telephone visits we even called and [facility] is taking new patients, so we even verified that he would be able to get an appt in a reasonable time at a psychiatry office.    That is why after refilling, and refilling for 30 days at a time, I finally gave #15 to help make it clear, he had to take responsibility and make the psychiatry appt. We can't refer him for this, as he has to make his own appt for this.  As far as billing, see Shauna as I don't see or look at the billing side.    I don't have a problem seeing him for medical issues, just not psychological issues.

## 2019-03-15 NOTE — Telephone Encounter (Signed)
Let pt. Know he could be a pt. Here still but he needed to see psychiatrist for the other medications he needed refilled zoloft, but got upset and said he did not want to be a pt. Here then and hung up the phone.

## 2019-03-15 NOTE — Telephone Encounter (Signed)
That is probably best.  I don't think we were going to see eye to eye.  I will copy Beverlee Nims on this.

## 2019-03-21 NOTE — Telephone Encounter (Signed)
Reviewed

## 2019-03-24 ENCOUNTER — Other Ambulatory Visit: Payer: Self-pay | Admitting: Medical

## 2019-06-14 IMAGING — DX DG ELBOW 2V*R*
1 series · 2 of 2 positions shown · non-contrast
Comparison: None.

CLINICAL DATA: Right elbow pain, no known injury

EXAM:
RIGHT ELBOW - 2 VIEW

[Series 1: elbow · 0.14mm/px · 2 of 2 slices shown]
[im 1/2]
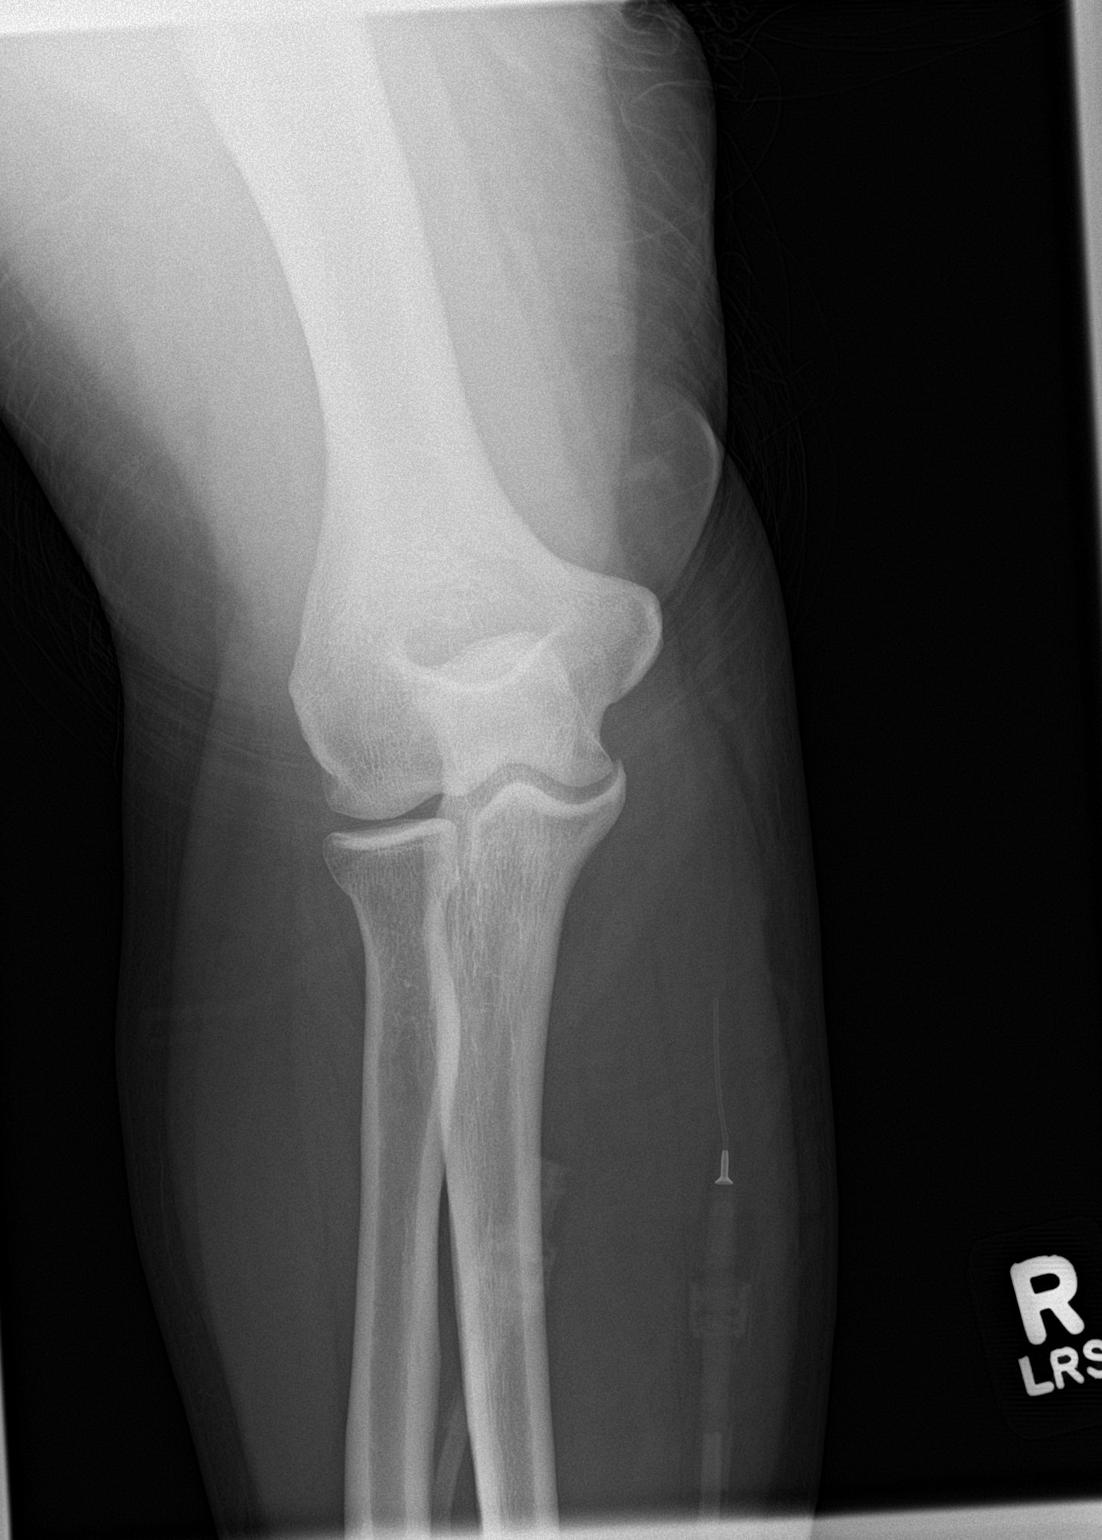
[im 2/2]
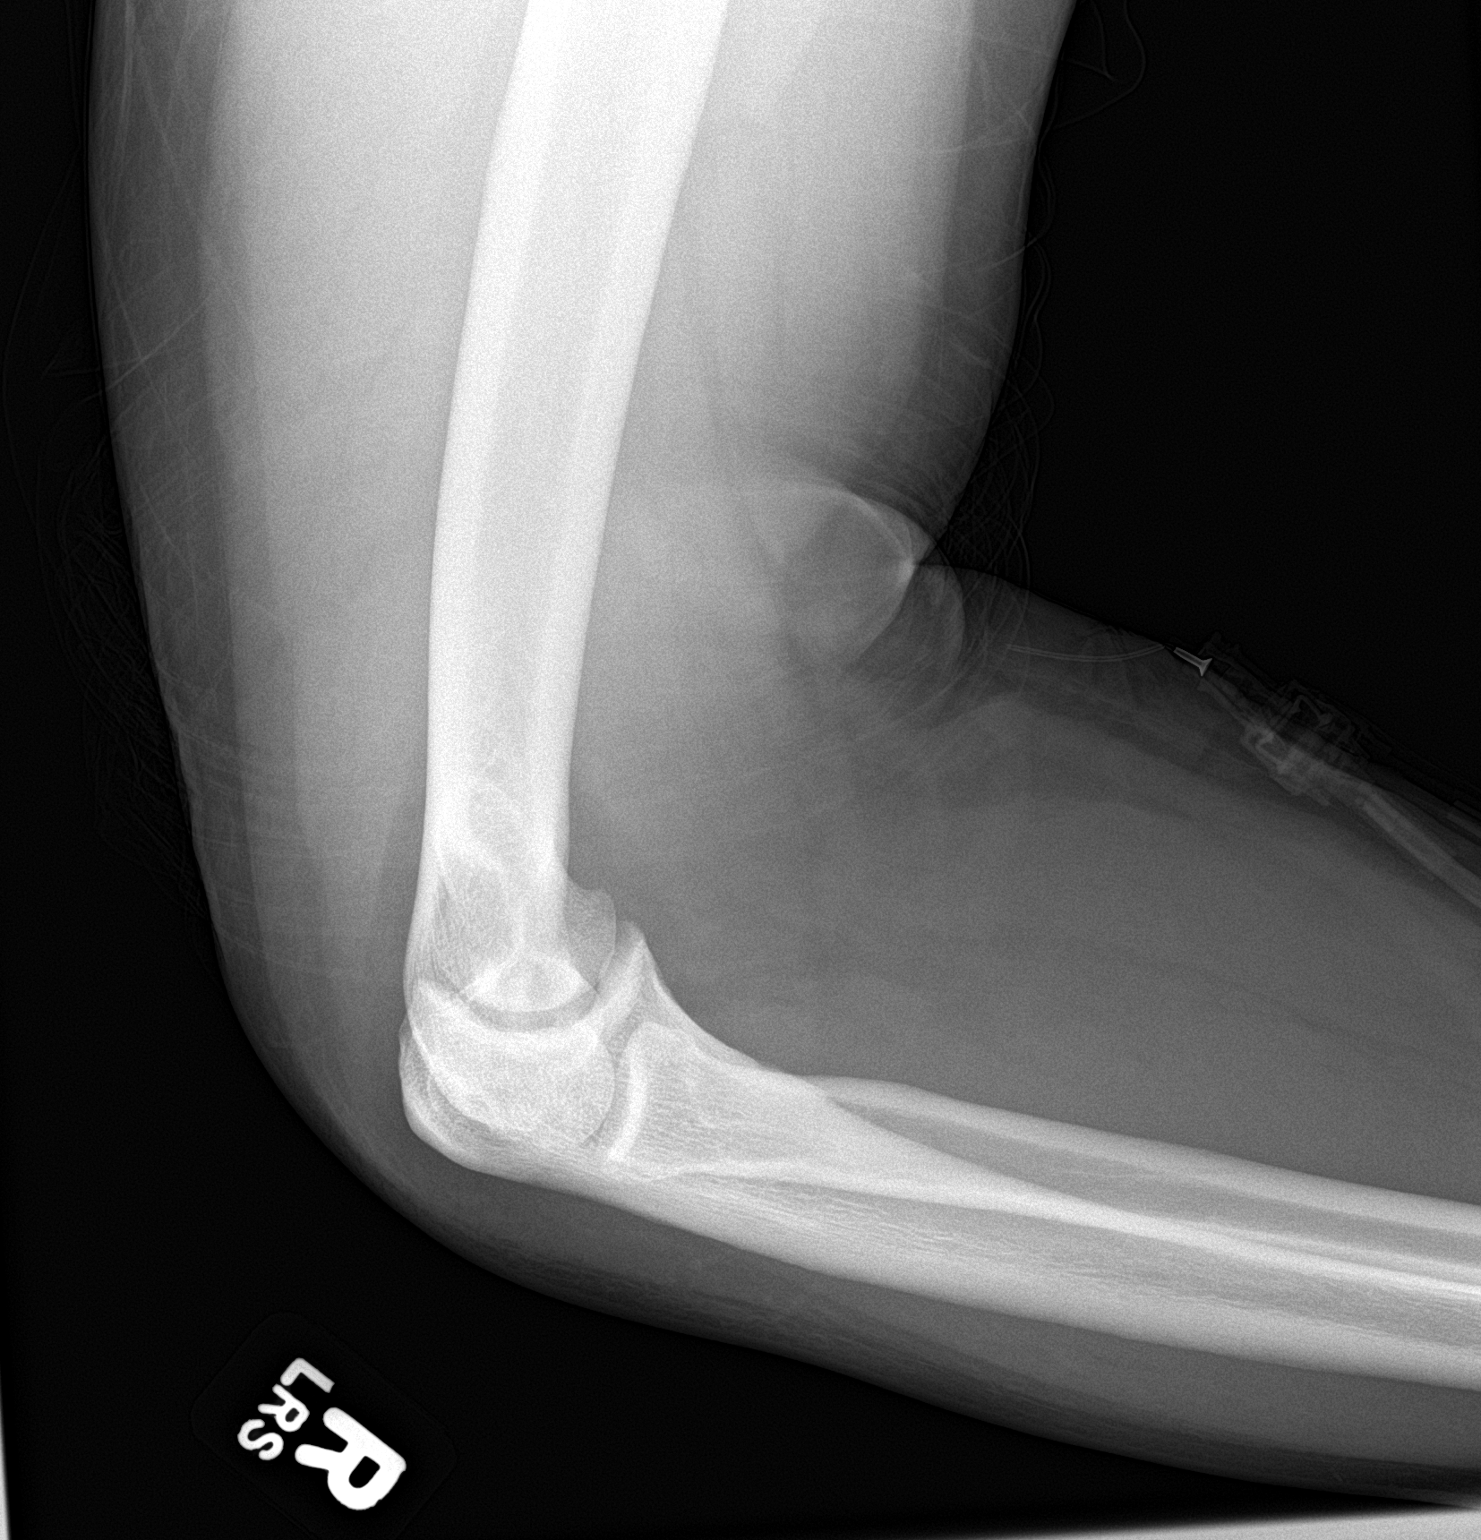

[2 of 2 positions shown; findings below may reference images not displayed]

FINDINGS: No fracture or dislocation is seen.

Displaced elbow joint fat pads on the lateral view, reflecting an
elbow joint effusion.

Mild soft tissue swelling overlying the olecranon.
IMPRESSION: Suspected olecranon bursitis with associated elbow joint effusion.

## 2019-07-08 ENCOUNTER — Other Ambulatory Visit: Payer: Self-pay | Admitting: Medical
